# Patient Record
Sex: Male | Born: 1945 | Race: White | Hispanic: No | Marital: Married | Smoking: Current every day smoker
Health system: Southern US, Community
[De-identification: ages and names within clinical notes are randomized; demographics above are authoritative.]

## PROBLEM LIST (undated history)

## (undated) DIAGNOSIS — H269 Unspecified cataract: Secondary | ICD-10-CM

## (undated) DIAGNOSIS — D72820 Lymphocytosis (symptomatic): Secondary | ICD-10-CM

## (undated) DIAGNOSIS — H5052 Exophoria: Secondary | ICD-10-CM

## (undated) DIAGNOSIS — M4802 Spinal stenosis, cervical region: Secondary | ICD-10-CM

## (undated) DIAGNOSIS — L509 Urticaria, unspecified: Secondary | ICD-10-CM

## (undated) DIAGNOSIS — D869 Sarcoidosis, unspecified: Secondary | ICD-10-CM

## (undated) DIAGNOSIS — H9190 Unspecified hearing loss, unspecified ear: Secondary | ICD-10-CM

## (undated) DIAGNOSIS — G250 Essential tremor: Secondary | ICD-10-CM

## (undated) DIAGNOSIS — F0390 Unspecified dementia without behavioral disturbance: Secondary | ICD-10-CM

## (undated) DIAGNOSIS — E559 Vitamin D deficiency, unspecified: Secondary | ICD-10-CM

## (undated) DIAGNOSIS — E039 Hypothyroidism, unspecified: Secondary | ICD-10-CM

## (undated) DIAGNOSIS — J309 Allergic rhinitis, unspecified: Secondary | ICD-10-CM

## (undated) DIAGNOSIS — K219 Gastro-esophageal reflux disease without esophagitis: Secondary | ICD-10-CM

## (undated) DIAGNOSIS — F329 Major depressive disorder, single episode, unspecified: Secondary | ICD-10-CM

## (undated) DIAGNOSIS — C61 Malignant neoplasm of prostate: Secondary | ICD-10-CM

## (undated) DIAGNOSIS — J449 Chronic obstructive pulmonary disease, unspecified: Secondary | ICD-10-CM

## (undated) HISTORY — DX: Gastro-esophageal reflux disease without esophagitis: K21.9

## (undated) HISTORY — DX: Unspecified cataract: H26.9

## (undated) HISTORY — PX: PROSTATE SURGERY: SHX751

## (undated) HISTORY — DX: Sarcoidosis, unspecified: D86.9

## (undated) HISTORY — DX: Allergic rhinitis, unspecified: J30.9

## (undated) HISTORY — DX: Major depressive disorder, single episode, unspecified: F32.9

## (undated) HISTORY — DX: Vitamin D deficiency, unspecified: E55.9

## (undated) HISTORY — DX: Malignant neoplasm of prostate: C61

## (undated) HISTORY — DX: Unspecified dementia, unspecified severity, without behavioral disturbance, psychotic disturbance, mood disturbance, and anxiety: F03.90

## (undated) HISTORY — DX: Urticaria, unspecified: L50.9

## (undated) HISTORY — DX: Exophoria: H50.52

## (undated) HISTORY — DX: Lymphocytosis (symptomatic): D72.820

## (undated) HISTORY — PX: PENECTOMY: SHX741

## (undated) HISTORY — DX: Unspecified hearing loss, unspecified ear: H91.90

## (undated) HISTORY — DX: Chronic obstructive pulmonary disease, unspecified: J44.9

## (undated) HISTORY — DX: Hypothyroidism, unspecified: E03.9

## (undated) HISTORY — DX: Essential tremor: G25.0

## (undated) HISTORY — DX: Spinal stenosis, cervical region: M48.02

---

## 1998-08-03 ENCOUNTER — Emergency Department (HOSPITAL_COMMUNITY): Admission: EM | Admit: 1998-08-03 | Discharge: 1998-08-03 | Payer: Self-pay | Admitting: Emergency Medicine

## 1998-08-03 ENCOUNTER — Encounter: Payer: Self-pay | Admitting: Emergency Medicine

## 1998-08-04 ENCOUNTER — Inpatient Hospital Stay (HOSPITAL_COMMUNITY): Admission: EM | Admit: 1998-08-04 | Discharge: 1998-08-05 | Payer: Self-pay

## 2002-08-29 ENCOUNTER — Emergency Department (HOSPITAL_COMMUNITY): Admission: EM | Admit: 2002-08-29 | Discharge: 2002-08-29 | Payer: Self-pay | Admitting: Emergency Medicine

## 2002-08-29 ENCOUNTER — Encounter: Payer: Self-pay | Admitting: Emergency Medicine

## 2004-03-17 ENCOUNTER — Emergency Department (HOSPITAL_COMMUNITY): Admission: EM | Admit: 2004-03-17 | Discharge: 2004-03-17 | Payer: Self-pay | Admitting: Emergency Medicine

## 2004-03-30 ENCOUNTER — Ambulatory Visit (HOSPITAL_COMMUNITY): Admission: RE | Admit: 2004-03-30 | Discharge: 2004-03-30 | Payer: Self-pay | Admitting: Orthopedic Surgery

## 2006-02-17 IMAGING — CR DG CERVICAL SPINE COMPLETE 4+V
6 series · 6 of 6 positions shown · non-contrast
Comparison: none

CLINICAL DATA: Neck pain radiating to left shoulder.
 CERVICAL SPINE, FIVE VIEWS ? 03/17/2004:
 No prior studies for comparison.

[view not recorded (1 of 6)]
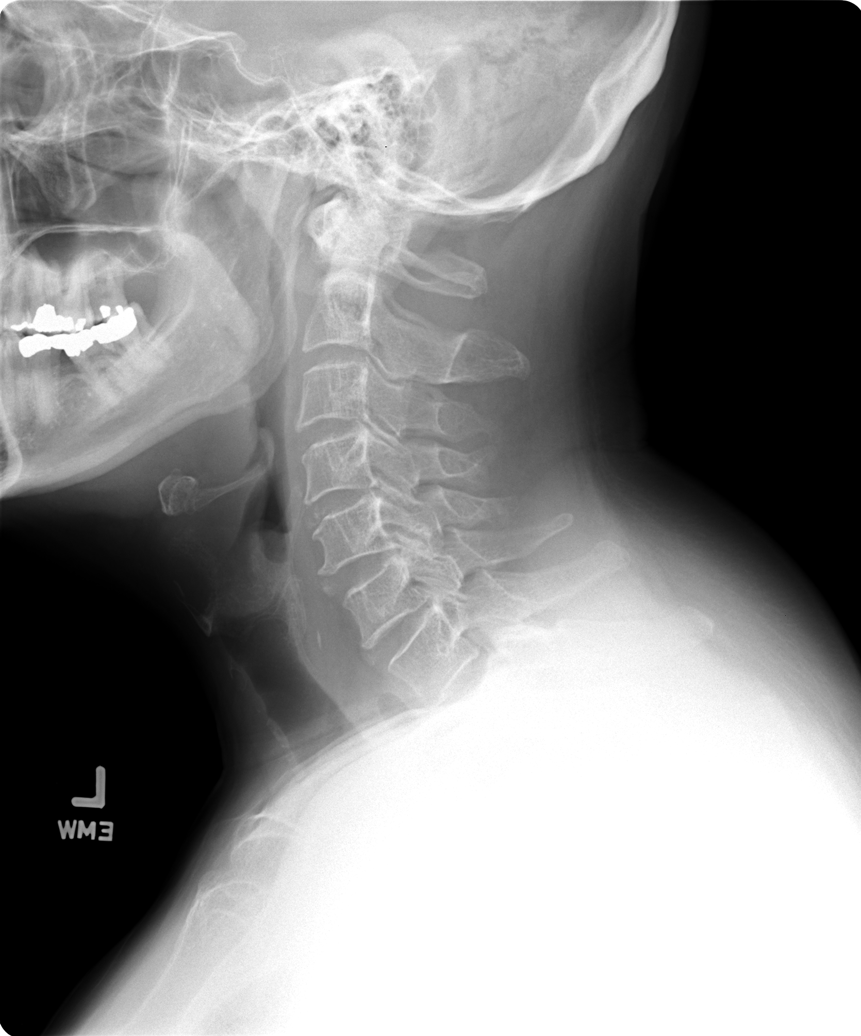

[view not recorded (2 of 6)]
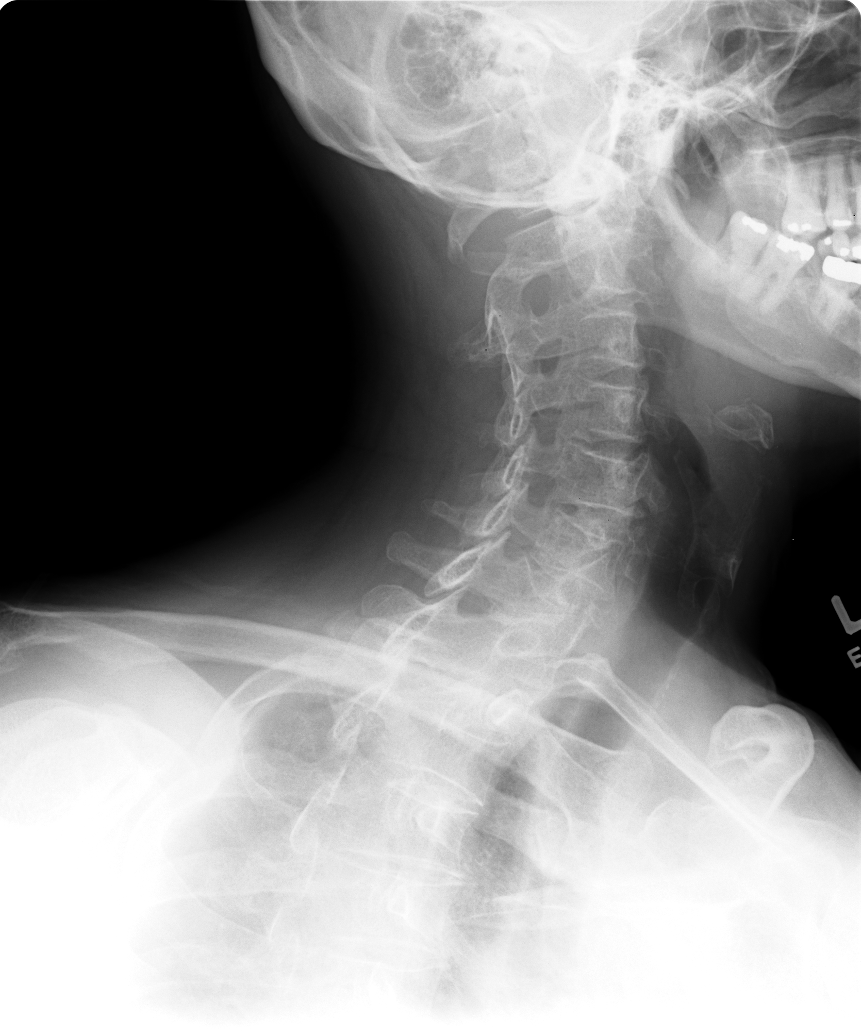

[view not recorded (3 of 6)]
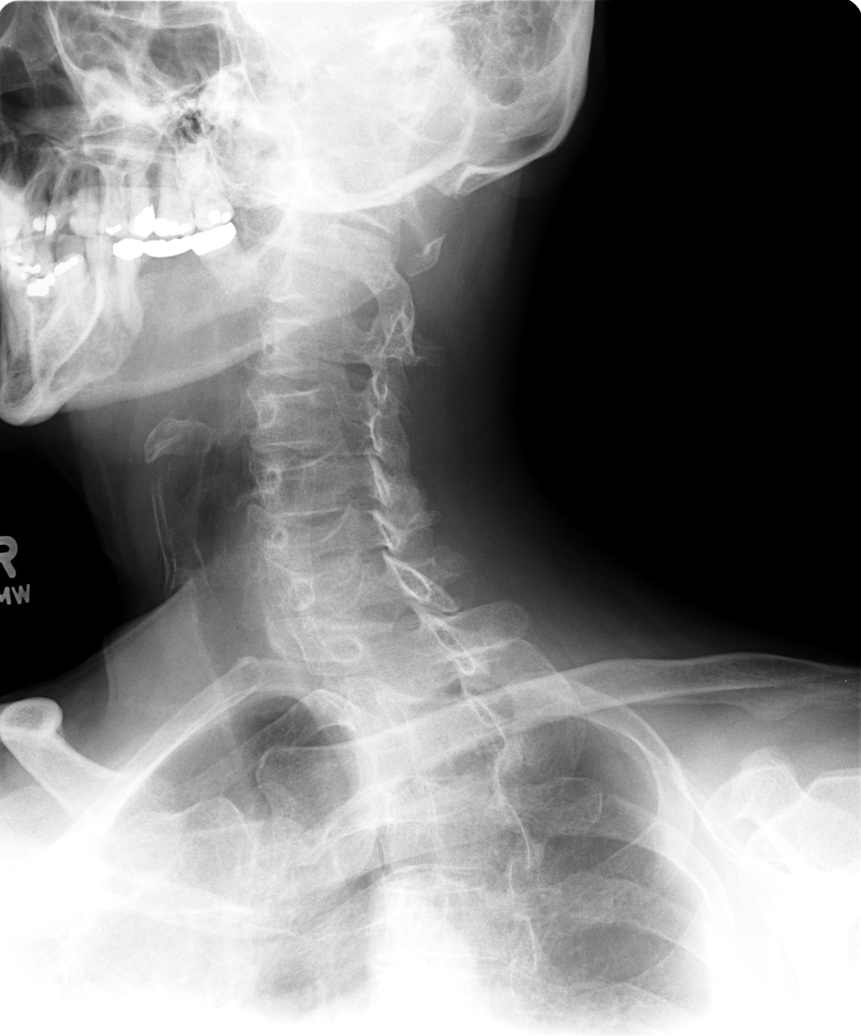

[view not recorded (4 of 6)]
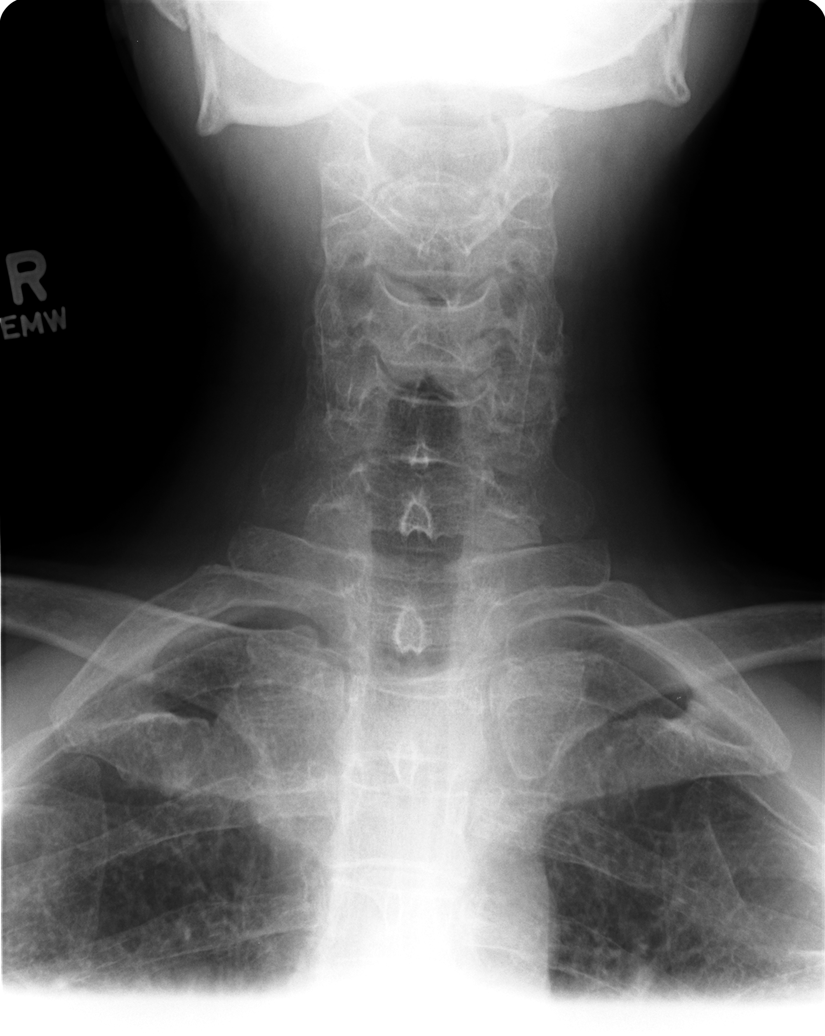

[view not recorded (5 of 6)]
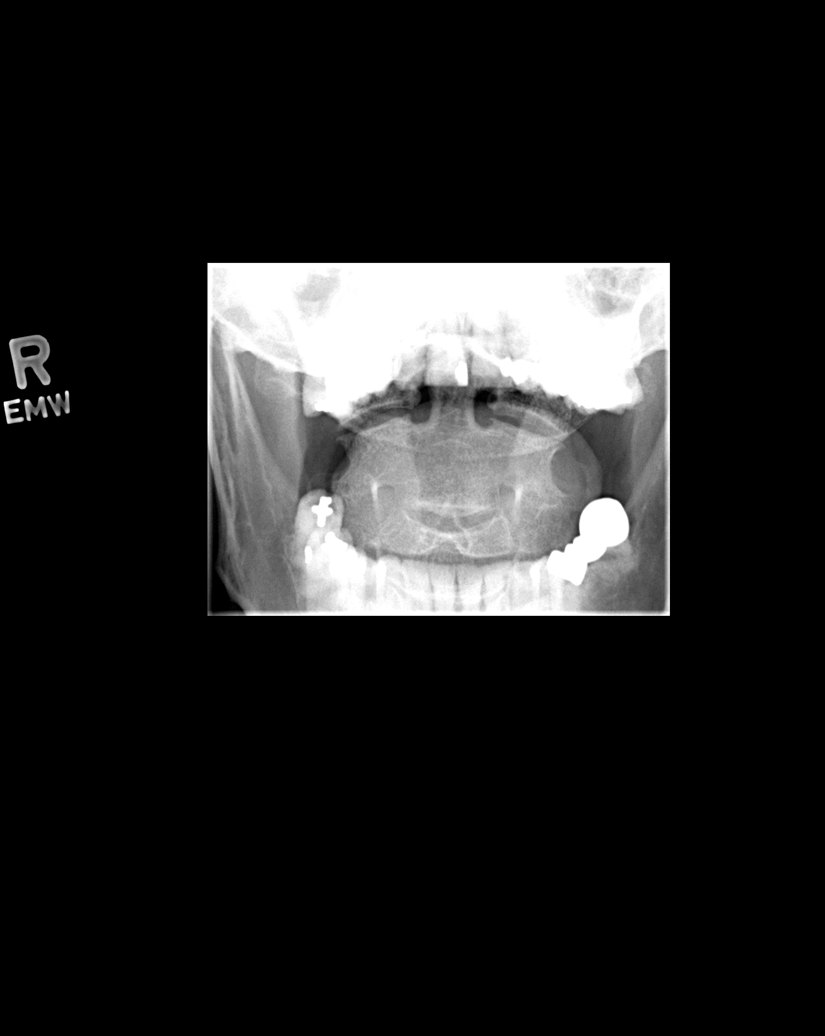

[view not recorded (6 of 6)]
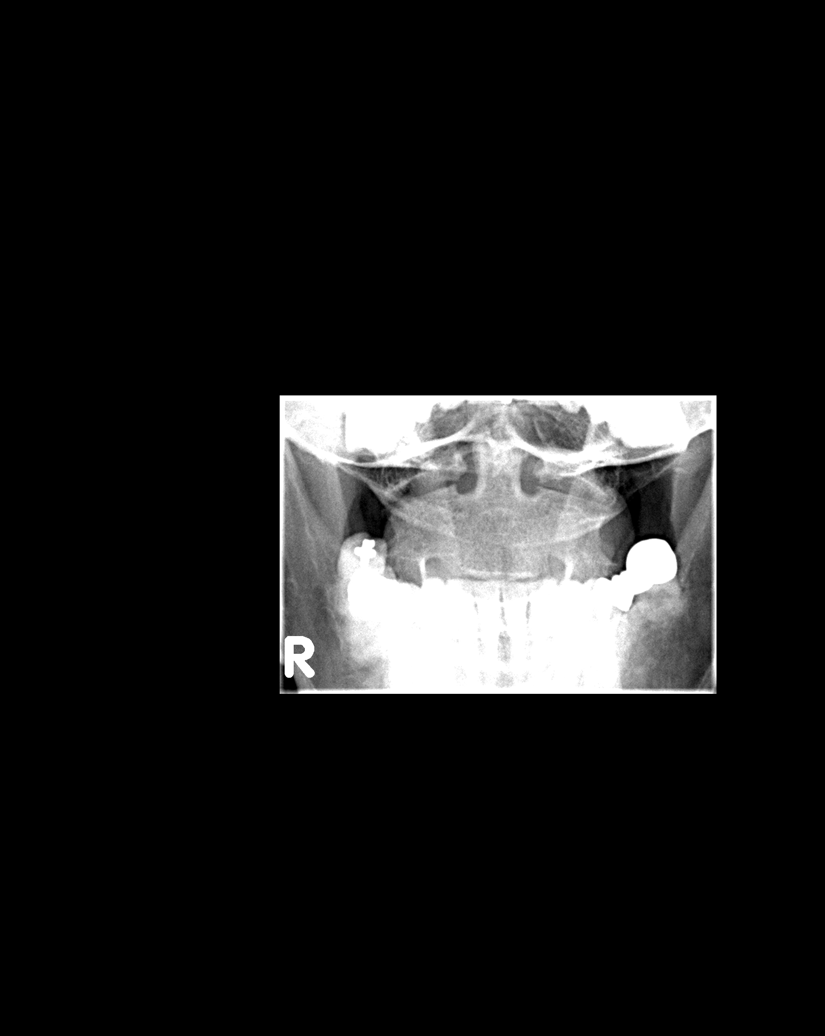

[6 of 6 positions shown; findings below may reference images not displayed]

FINDINGS: There is evidence of mild degenerative spondylosis involving C4-5, C5-6, and C6-7.  On oblique views bony foraminal stenosis is present at all three of these levels on the left and this may account for radicular symptoms.  If there are persistent symptoms, cervical MRI is recommended.  No evidence of fracture or listhesis.  Soft tissues are unremarkable.
IMPRESSION: Cervical spondylosis as described.  More prominent left-sided bony foraminal stenosis.

## 2009-11-27 ENCOUNTER — Encounter: Admission: RE | Admit: 2009-11-27 | Discharge: 2009-11-27 | Payer: Self-pay | Admitting: Family Medicine

## 2017-10-12 DIAGNOSIS — Z79899 Other long term (current) drug therapy: Secondary | ICD-10-CM | POA: Diagnosis not present

## 2017-10-12 DIAGNOSIS — Z23 Encounter for immunization: Secondary | ICD-10-CM | POA: Diagnosis not present

## 2017-10-12 DIAGNOSIS — F17200 Nicotine dependence, unspecified, uncomplicated: Secondary | ICD-10-CM | POA: Diagnosis not present

## 2017-10-12 DIAGNOSIS — G8911 Acute pain due to trauma: Secondary | ICD-10-CM | POA: Diagnosis not present

## 2017-10-12 DIAGNOSIS — Z888 Allergy status to other drugs, medicaments and biological substances status: Secondary | ICD-10-CM | POA: Diagnosis not present

## 2017-10-12 DIAGNOSIS — Z91040 Latex allergy status: Secondary | ICD-10-CM | POA: Diagnosis not present

## 2017-10-12 DIAGNOSIS — S0101XA Laceration without foreign body of scalp, initial encounter: Secondary | ICD-10-CM | POA: Diagnosis not present

## 2018-10-29 ENCOUNTER — Encounter: Payer: Self-pay | Admitting: Allergy

## 2018-10-29 ENCOUNTER — Encounter (INDEPENDENT_AMBULATORY_CARE_PROVIDER_SITE_OTHER): Payer: Self-pay

## 2018-10-29 ENCOUNTER — Ambulatory Visit (INDEPENDENT_AMBULATORY_CARE_PROVIDER_SITE_OTHER): Payer: PRIVATE HEALTH INSURANCE | Admitting: Allergy

## 2018-10-29 ENCOUNTER — Other Ambulatory Visit: Payer: Self-pay

## 2018-10-29 VITALS — BP 130/70 | HR 60 | Temp 98.1°F | Resp 18 | Ht 72.0 in | Wt 185.0 lb

## 2018-10-29 DIAGNOSIS — H1013 Acute atopic conjunctivitis, bilateral: Secondary | ICD-10-CM | POA: Diagnosis not present

## 2018-10-29 DIAGNOSIS — J449 Chronic obstructive pulmonary disease, unspecified: Secondary | ICD-10-CM

## 2018-10-29 DIAGNOSIS — J3089 Other allergic rhinitis: Secondary | ICD-10-CM

## 2018-10-29 MED ORDER — IPRATROPIUM BROMIDE 0.06 % NA SOLN: NASAL | 2 refills | Status: DC

## 2018-10-29 MED ORDER — LEVOCETIRIZINE DIHYDROCHLORIDE 5 MG PO TABS: 5.0000 mg | ORAL_TABLET | Freq: Every evening | ORAL | 2 refills | Status: DC

## 2018-10-29 MED ORDER — EPINEPHRINE 0.3 MG/0.3ML IJ SOAJ: 0.3000 mg | INTRAMUSCULAR | 1 refills | Status: AC | PRN

## 2018-10-29 NOTE — Patient Instructions (Addendum)
Allergic rhinitis with conjunctivitis  - on previous testing found to be allergic to grasses, trees, weeds, dust mites, molds  - on testing today remains allergic to grasses, trees, weeds, dust mites, molds  - allergen avoidance measures discussed/handouts provided  - recommend changing Zyrtec to either Allegra or Xyzal  - Azelastine not effective in controlling runny nose  - will try nasal Atrovent 2 sprays each nostril as needed for runny nose up to 3-4 time a day  - continue Singulair 10mg  daily  - allergen immunotherapy discussed today including protocol, benefits and risk.  Will proceed with this therapy.  Will prescribe Epipen to carry on days of your injections.  Consent signed today.  Schedule for start injection appointment.    - discuss with your eye doctor regarding your dry eye.   COPD  - continue recommendation per your [facility] with use of Tiotropium 2 puffs daily and Albuterol as needed   Follow-up 4-6 months or sooner if needed

## 2018-10-29 NOTE — Progress Notes (Signed)
New Patient Note  RE: Antonio Edwards MRN: [id number] DOB: 1945-05-05 Date of Office Visit: 10/29/2018  Referring provider: Dierdre Highman, MD Primary care provider: Lorelei Pont, MD  Chief Complaint: allergy shots  History of present illness: Antonio Edwards is a 73 y.o. male presenting today for consultation for allergen immunotherapy.  He follows with Dr. Shelah Lewandowsky at [facility] for allergy care.  He is referred today to start immunotherapy.    He states he has symptoms of watery eyes, runny nose with PND with hoarseness and cough.  Symptoms can occur year-round.  Symptoms ongoing for the past year.   He is using azelastine twice a day and he feels it makes his nose run more.  He does not feel the azelastine works.  He also is on zyrtec that he believes he has been on for the past year.  He also takes singulair daily. With his eye symptoms he reports having dry eye and does follow with an eye doctor at [facility].      He has COPD managed by his [place] allergist.  He is on tiotropium that he takes daily.  He states he does not have a albuterol inhaler at this time.    [facility] he had serum IgE environmental testing that showed in kU/L: d.farinae 0.16, Timothy grass 1.59, cockroach 0.12, penicillium 0.86, Cladosporium 0.26, Aspergillus 0.61, mountain cedar 0.12.  Total IgE 1716.  Skin prick testing was positive to molds, tree pollens, grass pollens, weed pollens. He had a sinus CT in 2019 that showed minimal mucosal thickening along the right sphenoid sinus otherwise clear paranasal sinuses.  Ostiomeatal units were patent.  Nasal passages patent.  Minimal leftward deviation of the septum. CT chest 2019 that showed areas of bronchial wall thickening and irregularity as can be seen with chronic bronchitis.  Patchy subpleural groundglass attenuation and mild reticulation are again noted and minimally changed dating back to the most remote examination.  A few punctate calcified granulomas are  seen in the left lung.  Stable appearance of the previously noted small bilateral nodules with reference 5 mm left lung base and lateral right lower lobe subpleural nodules, and 6 mm subpleural right middle lobe nodule.  Impression: Benign appearance.  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  Small nodules again noted.  Continue annual low-dose CT screening yearly.  Review of systems: Review of Systems  Constitutional: Negative for chills, fever and malaise/fatigue.  HENT: Negative for congestion, ear discharge, nosebleeds and sore throat.   Eyes: Negative for pain, discharge and redness.  Respiratory: Negative for cough, shortness of breath and wheezing.   Cardiovascular: Negative for chest pain.  Gastrointestinal: Negative for abdominal pain, constipation, diarrhea, heartburn, nausea and vomiting.  Musculoskeletal: Negative for joint pain.  Skin: Negative for itching and rash.  Neurological: Negative for headaches.    All other systems negative unless noted above in HPI  Past medical history: Past Medical History:  Diagnosis Date  . Prostate cancer ([place])   . Urticaria     Past surgical history: Past Surgical History:  Procedure Laterality Date  . PENECTOMY     to repair urinary leakage after prostate removal  . PROSTATE SURGERY     removal due to cancer    Family history:  Family History  Problem Relation Age of Onset  . Asthma Mother   . Asthma Father   . Lung disease Father     Social history: Lives in a home with carpeting with gas heating and  central cooling.  There are dogs and cats in the home.  No concern for water damage, mildew averages in the home.  He is a retired English as a second language teacher. Tobacco Use  . Smoking status: Current Every Day Smoker    Packs/day: 0.50    Types: Cigarettes  . Smokeless tobacco: Never Used    Medication List: Allergies as of 10/29/2018      Reactions   Alendronate Other (See Comments)   Red all over, skin begun falling off as if he had been  burned   Latex Hives   Aricept [donepezil Hcl] Other (See Comments)   Loss of taste      Medication List       Accurate as of October 29, 2018 12:21 PM. If you have any questions, ask your nurse or doctor.        albuterol 108 (90 Base) MCG/ACT inhaler Commonly known as: VENTOLIN HFA Inhale 2 puffs into the lungs every 6 (six) hours as needed for wheezing or shortness of breath.   alprostadil 125 MCG pellet Commonly known as: MUSE 125 mcg by Transurethral route as needed for erectile dysfunction. use no more than 3 times per week   aspirin EC 81 MG tablet Take 81 mg by mouth daily.   atorvastatin 20 MG tablet Commonly known as: LIPITOR Take 60 mg by mouth daily.   Azelastine HCl 137 MCG/SPRAY Soln Place 2 sprays into both nostrils 2 (two) times daily as needed.   Calcium 600-D 600-400 MG-UNIT Tabs Generic drug: Calcium Carbonate-Vitamin D3 Take 1 tablet by mouth 2 (two) times daily.   cetirizine 10 MG tablet Commonly known as: ZYRTEC Take 10 mg by mouth daily.   citalopram 40 MG tablet Commonly known as: CELEXA Take 40 mg by mouth daily.   EPINEPHrine 0.3 mg/0.3 mL Soaj injection Commonly known as: EPI-PEN Inject 0.3 mLs (0.3 mg total) into the muscle as needed for anaphylaxis. Started by: Shaylar Charmian Muff, MD   GLYCERIN-HYPROMELLOSE-PEG 400 OP Apply 1 drop to eye 2 (two) times daily.   ibuprofen 600 MG tablet Commonly known as: ADVIL Take 600 mg by mouth 3 (three) times daily as needed.   ipratropium 0.06 % nasal spray Commonly known as: ATROVENT 2 sprays in each nostril as needed up to 3-4 times daily. Started by: Shaylar Charmian Muff, MD   ketoconazole 2 % shampoo Commonly known as: NIZORAL Apply 1 application topically 3 (three) times a week.   levocetirizine 5 MG tablet Commonly known as: XYZAL Take 1 tablet (5 mg total) by mouth every evening. Started by: Shaylar Charmian Muff, MD   levothyroxine 75 MCG tablet Commonly known  as: SYNTHROID Take 75 mcg by mouth daily before breakfast.   memantine 10 MG tablet Commonly known as: NAMENDA Take 20 mg by mouth at bedtime.   montelukast 10 MG tablet Commonly known as: SINGULAIR Take 10 mg by mouth at bedtime.   rivastigmine 6 MG capsule Commonly known as: EXELON Take 6 mg by mouth 2 (two) times daily.   sildenafil 100 MG tablet Commonly known as: VIAGRA Take 100 mg by mouth daily as needed for erectile dysfunction.   Tiotropium Bromide Monohydrate 2.5 MCG/ACT Aers Inhale 2 puffs into the lungs daily.   Vitamin D (Cholecalciferol) 50 MCG (2000 UT) Caps Take 1 tablet by mouth daily.       Known medication allergies: Allergies  Allergen Reactions  . Alendronate Other (See Comments)    Red all over, skin begun falling off as if he  had been burned  . Latex Hives  . Aricept [Donepezil Hcl] Other (See Comments)    Loss of taste    Physical examination: Blood pressure 130/70, pulse 60, temperature 98.1 F (36.7 C), temperature source Temporal, resp. rate 18, height 6' (1.829 m), weight 185 lb (83.9 kg), SpO2 96 %.  General: Alert, interactive, in no acute distress. HEENT: PERRLA, TMs pearly gray, turbinates minimally edematous with clear discharge, post-pharynx non erythematous. Neck: Supple without lymphadenopathy. Lungs: Clear to auscultation without wheezing, rhonchi or rales. {no increased work of breathing. CV: Normal S1, S2 without murmurs. Abdomen: Nondistended, nontender. Skin: Warm and dry, without lesions or rashes. Extremities:  No clubbing, cyanosis or edema. Neuro:   Grossly intact.  Diagnositics/Labs: Labs: see HPI  Spirometry: FEV1: 2.07L 59%, FVC: 2.93L 61% predicted.   Allergy testing: environmental allergy skin prick testing is positive to grass pollens, weed pollens, tree pollens, molds, dust mites.  Allergy testing results were read and interpreted by provider, documented by clinical staff.   Assessment and plan:    Allergic rhinitis with conjunctivitis  - on previous testing found to be allergic to grasses, trees, weeds, dust mites, molds  - on testing today remains allergic to grasses, trees, weeds, dust mites, molds  - allergen avoidance measures discussed/handouts provided  - recommend changing Zyrtec to either Allegra or Xyzal  - Azelastine not effective in controlling runny nose  - will try nasal Atrovent 2 sprays each nostril as needed for runny nose up to 3-4 time a day  - continue Singulair 10mg  daily  - allergen immunotherapy discussed today including protocol, benefits and risk.  Will proceed with this therapy.  Will prescribe Epipen to carry on days of your injections.  Consent signed today.  Schedule for start injection appointment.    - discuss with your eye doctor regarding your dry eye.   COPD  - continue recommendation per your [facility] All[facility] with use of Tiotropium 2 puffs daily and Albuterol as needed   Follow-up 4-6 months or sooner if needed I appreciate the opportunity to take part in [place] care. Please do not hesitate to contact me with questions.  Sincerely,   Prudy Feeler, MD Allergy/Immunology [facility] [place] [facility] [place]

## 2018-10-30 ENCOUNTER — Telehealth: Payer: Self-pay

## 2018-10-30 MED ORDER — LEVOCETIRIZINE DIHYDROCHLORIDE 5 MG PO TABS: 5.0000 mg | ORAL_TABLET | Freq: Every evening | ORAL | 2 refills | Status: AC

## 2018-10-30 MED ORDER — LEVOCETIRIZINE DIHYDROCHLORIDE 5 MG PO TABS: 5.0000 mg | ORAL_TABLET | Freq: Every evening | ORAL | 2 refills | Status: DC

## 2018-10-30 MED ORDER — LORATADINE 10 MG PO TABS: 10.0000 mg | ORAL_TABLET | Freq: Every day | ORAL | 1 refills | Status: DC

## 2018-10-30 NOTE — Addendum Note (Signed)
Addended by: Lucrezia Starch I on: 10/30/2018 01:27 PM   Modules accepted: Orders

## 2018-10-30 NOTE — Addendum Note (Signed)
Addended by: Lucrezia Starch I on: 10/30/2018 10:46 AM   Modules accepted: Orders

## 2018-10-30 NOTE — Telephone Encounter (Signed)
Patient's insurance will not cover Xyzal. Suggested alternatives are Zyrtec or Claritin. Per verbal discussion with Dr. Lubertha Basque I have switched patient's prescription to loratadine 10 mg tablet po qd.

## 2018-10-30 NOTE — Addendum Note (Signed)
Addended by: Lucrezia Starch I on: 10/30/2018 11:50 AM   Modules accepted: Orders

## 2018-11-03 DIAGNOSIS — J301 Allergic rhinitis due to pollen: Secondary | ICD-10-CM

## 2018-11-03 NOTE — Progress Notes (Signed)
VIALS EXP 11-03-19

## 2018-11-03 NOTE — Addendum Note (Signed)
Addended by: Theresia Lo on: 11/03/2018 02:56 PM   Modules accepted: Orders

## 2018-11-04 DIAGNOSIS — J3089 Other allergic rhinitis: Secondary | ICD-10-CM

## 2018-11-12 ENCOUNTER — Ambulatory Visit (INDEPENDENT_AMBULATORY_CARE_PROVIDER_SITE_OTHER): Payer: PRIVATE HEALTH INSURANCE | Admitting: *Deleted

## 2018-11-12 ENCOUNTER — Other Ambulatory Visit: Payer: Self-pay

## 2018-11-12 DIAGNOSIS — J309 Allergic rhinitis, unspecified: Secondary | ICD-10-CM

## 2018-11-12 NOTE — Progress Notes (Signed)
Immunotherapy   Patient Details  Name: Antonio Edwards MRN: [id number] Date of Birth: [birth year]  11/12/2018  [name] started injections for  Pollen & Mite-Mold-CR Following schedule: B  Frequency:1 time per week Epi-Pen:Epi-Pen Available  Consent signed and patient instructions given. No problems after 30 minutes in the office.   [name] 11/12/2018, 9:08 AM

## 2018-11-19 ENCOUNTER — Ambulatory Visit (INDEPENDENT_AMBULATORY_CARE_PROVIDER_SITE_OTHER): Payer: PRIVATE HEALTH INSURANCE | Admitting: *Deleted

## 2018-11-19 DIAGNOSIS — J309 Allergic rhinitis, unspecified: Secondary | ICD-10-CM

## 2018-11-26 ENCOUNTER — Ambulatory Visit (INDEPENDENT_AMBULATORY_CARE_PROVIDER_SITE_OTHER): Payer: PRIVATE HEALTH INSURANCE | Admitting: *Deleted

## 2018-11-26 DIAGNOSIS — J309 Allergic rhinitis, unspecified: Secondary | ICD-10-CM | POA: Diagnosis not present

## 2018-12-03 ENCOUNTER — Ambulatory Visit (INDEPENDENT_AMBULATORY_CARE_PROVIDER_SITE_OTHER): Payer: PRIVATE HEALTH INSURANCE

## 2018-12-03 DIAGNOSIS — J309 Allergic rhinitis, unspecified: Secondary | ICD-10-CM

## 2018-12-10 ENCOUNTER — Ambulatory Visit (INDEPENDENT_AMBULATORY_CARE_PROVIDER_SITE_OTHER): Payer: PRIVATE HEALTH INSURANCE

## 2018-12-10 ENCOUNTER — Encounter: Payer: Self-pay | Admitting: *Deleted

## 2018-12-10 DIAGNOSIS — J309 Allergic rhinitis, unspecified: Secondary | ICD-10-CM | POA: Diagnosis not present

## 2018-12-17 ENCOUNTER — Ambulatory Visit (INDEPENDENT_AMBULATORY_CARE_PROVIDER_SITE_OTHER): Payer: PRIVATE HEALTH INSURANCE | Admitting: *Deleted

## 2018-12-17 DIAGNOSIS — J309 Allergic rhinitis, unspecified: Secondary | ICD-10-CM | POA: Diagnosis not present

## 2018-12-24 ENCOUNTER — Ambulatory Visit (INDEPENDENT_AMBULATORY_CARE_PROVIDER_SITE_OTHER): Payer: PRIVATE HEALTH INSURANCE | Admitting: *Deleted

## 2018-12-24 DIAGNOSIS — J309 Allergic rhinitis, unspecified: Secondary | ICD-10-CM

## 2018-12-31 ENCOUNTER — Ambulatory Visit (INDEPENDENT_AMBULATORY_CARE_PROVIDER_SITE_OTHER): Payer: PRIVATE HEALTH INSURANCE

## 2018-12-31 DIAGNOSIS — J309 Allergic rhinitis, unspecified: Secondary | ICD-10-CM

## 2019-01-07 ENCOUNTER — Ambulatory Visit (INDEPENDENT_AMBULATORY_CARE_PROVIDER_SITE_OTHER): Payer: PRIVATE HEALTH INSURANCE | Admitting: *Deleted

## 2019-01-07 DIAGNOSIS — J309 Allergic rhinitis, unspecified: Secondary | ICD-10-CM

## 2019-01-14 ENCOUNTER — Ambulatory Visit (INDEPENDENT_AMBULATORY_CARE_PROVIDER_SITE_OTHER): Payer: PRIVATE HEALTH INSURANCE | Admitting: *Deleted

## 2019-01-14 DIAGNOSIS — J309 Allergic rhinitis, unspecified: Secondary | ICD-10-CM

## 2019-01-21 ENCOUNTER — Ambulatory Visit (INDEPENDENT_AMBULATORY_CARE_PROVIDER_SITE_OTHER): Payer: PRIVATE HEALTH INSURANCE

## 2019-01-21 DIAGNOSIS — J309 Allergic rhinitis, unspecified: Secondary | ICD-10-CM | POA: Diagnosis not present

## 2019-01-28 ENCOUNTER — Ambulatory Visit (INDEPENDENT_AMBULATORY_CARE_PROVIDER_SITE_OTHER): Payer: PRIVATE HEALTH INSURANCE | Admitting: *Deleted

## 2019-01-28 DIAGNOSIS — J309 Allergic rhinitis, unspecified: Secondary | ICD-10-CM | POA: Diagnosis not present

## 2019-02-04 ENCOUNTER — Ambulatory Visit (INDEPENDENT_AMBULATORY_CARE_PROVIDER_SITE_OTHER): Payer: PRIVATE HEALTH INSURANCE

## 2019-02-04 DIAGNOSIS — J309 Allergic rhinitis, unspecified: Secondary | ICD-10-CM

## 2019-02-10 ENCOUNTER — Ambulatory Visit (INDEPENDENT_AMBULATORY_CARE_PROVIDER_SITE_OTHER): Payer: PRIVATE HEALTH INSURANCE | Admitting: *Deleted

## 2019-02-10 DIAGNOSIS — J309 Allergic rhinitis, unspecified: Secondary | ICD-10-CM

## 2019-02-18 ENCOUNTER — Ambulatory Visit (INDEPENDENT_AMBULATORY_CARE_PROVIDER_SITE_OTHER): Payer: No Typology Code available for payment source | Admitting: *Deleted

## 2019-02-18 DIAGNOSIS — J309 Allergic rhinitis, unspecified: Secondary | ICD-10-CM | POA: Diagnosis not present

## 2019-02-25 ENCOUNTER — Ambulatory Visit (INDEPENDENT_AMBULATORY_CARE_PROVIDER_SITE_OTHER): Payer: No Typology Code available for payment source

## 2019-02-25 DIAGNOSIS — J309 Allergic rhinitis, unspecified: Secondary | ICD-10-CM

## 2019-03-04 ENCOUNTER — Ambulatory Visit (INDEPENDENT_AMBULATORY_CARE_PROVIDER_SITE_OTHER): Payer: No Typology Code available for payment source

## 2019-03-04 DIAGNOSIS — J309 Allergic rhinitis, unspecified: Secondary | ICD-10-CM | POA: Diagnosis not present

## 2019-03-10 ENCOUNTER — Ambulatory Visit (INDEPENDENT_AMBULATORY_CARE_PROVIDER_SITE_OTHER): Payer: No Typology Code available for payment source | Admitting: *Deleted

## 2019-03-10 DIAGNOSIS — J309 Allergic rhinitis, unspecified: Secondary | ICD-10-CM

## 2019-03-18 ENCOUNTER — Ambulatory Visit (INDEPENDENT_AMBULATORY_CARE_PROVIDER_SITE_OTHER): Payer: No Typology Code available for payment source

## 2019-03-18 DIAGNOSIS — J309 Allergic rhinitis, unspecified: Secondary | ICD-10-CM

## 2019-03-25 ENCOUNTER — Ambulatory Visit (INDEPENDENT_AMBULATORY_CARE_PROVIDER_SITE_OTHER): Payer: No Typology Code available for payment source

## 2019-03-25 DIAGNOSIS — J309 Allergic rhinitis, unspecified: Secondary | ICD-10-CM

## 2019-03-31 ENCOUNTER — Encounter: Payer: Self-pay | Admitting: Allergy

## 2019-03-31 ENCOUNTER — Ambulatory Visit (INDEPENDENT_AMBULATORY_CARE_PROVIDER_SITE_OTHER): Payer: No Typology Code available for payment source | Admitting: Allergy

## 2019-03-31 ENCOUNTER — Other Ambulatory Visit: Payer: Self-pay

## 2019-03-31 VITALS — BP 136/80 | HR 57 | Wt 191.6 lb

## 2019-03-31 DIAGNOSIS — J3089 Other allergic rhinitis: Secondary | ICD-10-CM

## 2019-03-31 DIAGNOSIS — H1013 Acute atopic conjunctivitis, bilateral: Secondary | ICD-10-CM

## 2019-03-31 DIAGNOSIS — J449 Chronic obstructive pulmonary disease, unspecified: Secondary | ICD-10-CM | POA: Diagnosis not present

## 2019-03-31 NOTE — Patient Instructions (Addendum)
Allergic rhinitis with conjunctivitis  - continue avoidance measures for grasses, trees, weeds, dust mites, molds  - recommend either Allegra or Xyzal for your long-acting antihistamine  - Azelastine not effective in controlling runny nose  - continue use of nasal Atrovent 2 sprays each nostril as needed for runny nose up to 3-4 time a day  - continue Singulair 10mg  daily  - continue allergen immunotherapy (allergy shots) per schedule.  You are now in the maintenance (red) vials!  COPD  - continue recommendation per your [facility] Allergist with use of Tiotropium 2 puffs daily and Albuterol as needed   Follow-up 6 months or sooner if needed

## 2019-03-31 NOTE — Progress Notes (Signed)
Follow-up Note  RE: Antonio Edwards MRN: [id number] DOB: 1946/01/17 Date of Office Visit: 03/31/2019   History of present illness: Antonio Edwards is a 74 y.o. male presenting today for follow-up of allergic rhinitis with conjunctivitis on immunotherapy and COPD.  His COPD is being followed by the [place].  He was last seen in the office on 10/29/2018 by myself.  He states he has been doing well since the last visit without any major health changes, surgeries or hospitalizations.   He does states he has dry eyes however reports a lot of watery eyes and has been told he has issues with clog tear duct.  He has an upcoming [facility] appt to discuss treatments for his tear ducts.   He has reached red vial of his allergy shots.  He states he does feel allergy symptoms have decreased since being on shots but does have occasional nasal drainage.  He does have nasal atrovent to use for nasal drainage.  He does continue to take singulair daily.  Currently not taking any antihistamines.   He continues on tiotropium for management of COPD.    Review of systems: Review of Systems  Constitutional: Negative.   HENT: Negative.   Eyes: Positive for discharge (watery).  Respiratory: Negative.   Cardiovascular: Negative.   Gastrointestinal: Negative.   Musculoskeletal: Negative.   Skin: Negative.   Neurological: Negative.     All other systems negative unless noted above in HPI  Past medical/social/surgical/family history have been reviewed and are unchanged unless specifically indicated below.  No changes  Medication List: Current Outpatient Medications  Medication Sig Dispense Refill  . albuterol (VENTOLIN HFA) 108 (90 Base) MCG/ACT inhaler Inhale 2 puffs into the lungs every 6 (six) hours as needed for wheezing or shortness of breath.    . alprostadil (MUSE) 125 MCG pellet 125 mcg by Transurethral route as needed for erectile dysfunction. use no more than 3 times per week    . aspirin EC 81 MG  tablet Take 81 mg by mouth daily.    Marland Kitchen atorvastatin (LIPITOR) 20 MG tablet Take 60 mg by mouth daily.    . Calcium Carbonate-Vitamin D3 (CALCIUM 600-D) 600-400 MG-UNIT TABS Take 1 tablet by mouth 2 (two) times daily.    . cetirizine (ZYRTEC) 10 MG tablet Take 10 mg by mouth daily.    . citalopram (CELEXA) 40 MG tablet Take 40 mg by mouth daily.    Marland Kitchen EPINEPHrine 0.3 mg/0.3 mL IJ SOAJ injection Inject 0.3 mLs (0.3 mg total) into the muscle as needed for anaphylaxis. 2 each 1  . GLYCERIN-HYPROMELLOSE-PEG 400 OP Apply 1 drop to eye 2 (two) times daily.    Marland Kitchen ibuprofen (ADVIL) 600 MG tablet Take 600 mg by mouth 3 (three) times daily as needed.    Marland Kitchen ketoconazole (NIZORAL) 2 % shampoo Apply 1 application topically 3 (three) times a week.    . levocetirizine (XYZAL) 5 MG tablet Take 1 tablet (5 mg total) by mouth every evening. 90 tablet 2  . levothyroxine (SYNTHROID) 75 MCG tablet Take 75 mcg by mouth daily before breakfast.    . memantine (NAMENDA) 10 MG tablet Take 20 mg by mouth at bedtime.    . montelukast (SINGULAIR) 10 MG tablet Take 10 mg by mouth at bedtime.    . rivastigmine (EXELON) 6 MG capsule Take 6 mg by mouth 2 (two) times daily.    . sildenafil (VIAGRA) 100 MG tablet Take 100 mg by mouth daily  as needed for erectile dysfunction.    . Tiotropium Bromide Monohydrate 2.5 MCG/ACT AERS Inhale 2 puffs into the lungs daily.    . Vitamin D, Cholecalciferol, 50 MCG (2000 UT) CAPS Take 1 tablet by mouth daily.    Marland Kitchen ipratropium (ATROVENT) 0.06 % nasal spray 2 sprays in each nostril as needed up to 3-4 times daily. (Patient not taking: Reported on 03/31/2019) 45 mL 2   No current facility-administered medications for this visit.     Known medication allergies: Allergies  Allergen Reactions  . Alendronate Other (See Comments)    Red all over, skin begun falling off as if he had been burned  . Latex Hives  . Aricept [Donepezil Hcl] Other (See Comments)    Loss of taste     Physical  examination: Blood pressure 136/80, pulse (!) 57, weight 191 lb 9.6 oz (86.9 kg), SpO2 97 %.  General: Alert, interactive, in no acute distress. HEENT: PERRLA, TMs pearly gray, turbinates non-edematous without discharge, post-pharynx non erythematous. Neck: Supple without lymphadenopathy. Lungs: Clear to auscultation without wheezing, rhonchi or rales. {no increased work of breathing. CV: Normal S1, S2 without murmurs. Abdomen: Nondistended, nontender. Skin: Warm and dry, without lesions or rashes. Extremities:  No clubbing, cyanosis or edema. Neuro:   Grossly intact.  Diagnositics/Labs: Immunotherapy injections given today in office  Assessment and plan:   Allergic rhinitis with conjunctivitis  - doing well on immunotherapy now at maintenance dosing  - continue avoidance measures for grasses, trees, weeds, dust mites, molds  - recommend either Allegra or Xyzal for your long-acting antihistamine  - Azelastine not effective in controlling runny nose  - continue use of nasal Atrovent 2 sprays each nostril as needed for runny nose up to 3-4 time a day  - continue Singulair 10mg  daily  - continue allergen immunotherapy (allergy shots) per schedule.   COPD  - continue recommendation per your [facility] All[facility] with use of Tiotropium 2 puffs daily and Albuterol as needed   Follow-up 6 months or sooner if needed  I appreciate the opportunity to take part in [place] care. Please do not hesitate to contact me with questions. Sincerely,   Prudy Feeler, MD Allergy/Immunology [facility] [place] [facility] [place]

## 2019-04-08 ENCOUNTER — Ambulatory Visit (INDEPENDENT_AMBULATORY_CARE_PROVIDER_SITE_OTHER): Payer: No Typology Code available for payment source

## 2019-04-08 DIAGNOSIS — J3089 Other allergic rhinitis: Secondary | ICD-10-CM

## 2019-04-15 ENCOUNTER — Ambulatory Visit (INDEPENDENT_AMBULATORY_CARE_PROVIDER_SITE_OTHER): Payer: No Typology Code available for payment source

## 2019-04-15 DIAGNOSIS — J3089 Other allergic rhinitis: Secondary | ICD-10-CM

## 2019-04-22 ENCOUNTER — Encounter: Payer: Self-pay | Admitting: Allergy

## 2019-04-22 ENCOUNTER — Ambulatory Visit (INDEPENDENT_AMBULATORY_CARE_PROVIDER_SITE_OTHER): Payer: No Typology Code available for payment source

## 2019-04-22 DIAGNOSIS — J3089 Other allergic rhinitis: Secondary | ICD-10-CM

## 2019-04-29 ENCOUNTER — Ambulatory Visit (INDEPENDENT_AMBULATORY_CARE_PROVIDER_SITE_OTHER): Payer: No Typology Code available for payment source

## 2019-04-29 DIAGNOSIS — J3089 Other allergic rhinitis: Secondary | ICD-10-CM | POA: Diagnosis not present

## 2019-05-07 ENCOUNTER — Ambulatory Visit (INDEPENDENT_AMBULATORY_CARE_PROVIDER_SITE_OTHER): Payer: No Typology Code available for payment source | Admitting: *Deleted

## 2019-05-07 DIAGNOSIS — J309 Allergic rhinitis, unspecified: Secondary | ICD-10-CM

## 2019-05-13 ENCOUNTER — Ambulatory Visit (INDEPENDENT_AMBULATORY_CARE_PROVIDER_SITE_OTHER): Payer: No Typology Code available for payment source | Admitting: *Deleted

## 2019-05-13 DIAGNOSIS — J309 Allergic rhinitis, unspecified: Secondary | ICD-10-CM

## 2019-05-20 ENCOUNTER — Ambulatory Visit (INDEPENDENT_AMBULATORY_CARE_PROVIDER_SITE_OTHER): Payer: No Typology Code available for payment source

## 2019-05-20 DIAGNOSIS — J309 Allergic rhinitis, unspecified: Secondary | ICD-10-CM | POA: Diagnosis not present

## 2019-05-26 NOTE — Progress Notes (Signed)
VIALS EXP 05-25-20

## 2019-05-27 ENCOUNTER — Ambulatory Visit (INDEPENDENT_AMBULATORY_CARE_PROVIDER_SITE_OTHER): Payer: No Typology Code available for payment source

## 2019-05-27 DIAGNOSIS — J309 Allergic rhinitis, unspecified: Secondary | ICD-10-CM | POA: Diagnosis not present

## 2019-05-31 DIAGNOSIS — J3089 Other allergic rhinitis: Secondary | ICD-10-CM

## 2019-06-01 DIAGNOSIS — J301 Allergic rhinitis due to pollen: Secondary | ICD-10-CM

## 2019-06-03 ENCOUNTER — Ambulatory Visit (INDEPENDENT_AMBULATORY_CARE_PROVIDER_SITE_OTHER): Payer: No Typology Code available for payment source

## 2019-06-03 DIAGNOSIS — J309 Allergic rhinitis, unspecified: Secondary | ICD-10-CM | POA: Diagnosis not present

## 2019-06-10 ENCOUNTER — Ambulatory Visit (INDEPENDENT_AMBULATORY_CARE_PROVIDER_SITE_OTHER): Payer: No Typology Code available for payment source

## 2019-06-10 DIAGNOSIS — J309 Allergic rhinitis, unspecified: Secondary | ICD-10-CM | POA: Diagnosis not present

## 2019-06-17 ENCOUNTER — Ambulatory Visit (INDEPENDENT_AMBULATORY_CARE_PROVIDER_SITE_OTHER): Payer: No Typology Code available for payment source | Admitting: *Deleted

## 2019-06-17 DIAGNOSIS — J309 Allergic rhinitis, unspecified: Secondary | ICD-10-CM | POA: Diagnosis not present

## 2019-06-24 ENCOUNTER — Ambulatory Visit (INDEPENDENT_AMBULATORY_CARE_PROVIDER_SITE_OTHER): Payer: No Typology Code available for payment source

## 2019-06-24 DIAGNOSIS — J309 Allergic rhinitis, unspecified: Secondary | ICD-10-CM

## 2019-07-01 ENCOUNTER — Ambulatory Visit (INDEPENDENT_AMBULATORY_CARE_PROVIDER_SITE_OTHER): Payer: No Typology Code available for payment source

## 2019-07-01 DIAGNOSIS — J309 Allergic rhinitis, unspecified: Secondary | ICD-10-CM | POA: Diagnosis not present

## 2019-07-08 ENCOUNTER — Ambulatory Visit (INDEPENDENT_AMBULATORY_CARE_PROVIDER_SITE_OTHER): Payer: No Typology Code available for payment source

## 2019-07-08 DIAGNOSIS — J309 Allergic rhinitis, unspecified: Secondary | ICD-10-CM | POA: Diagnosis not present

## 2019-07-15 ENCOUNTER — Ambulatory Visit (INDEPENDENT_AMBULATORY_CARE_PROVIDER_SITE_OTHER): Payer: No Typology Code available for payment source | Admitting: *Deleted

## 2019-07-15 DIAGNOSIS — J309 Allergic rhinitis, unspecified: Secondary | ICD-10-CM | POA: Diagnosis not present

## 2019-07-19 ENCOUNTER — Encounter: Payer: Self-pay | Admitting: Allergy

## 2019-07-22 ENCOUNTER — Ambulatory Visit (INDEPENDENT_AMBULATORY_CARE_PROVIDER_SITE_OTHER): Payer: No Typology Code available for payment source

## 2019-07-22 ENCOUNTER — Encounter: Payer: Self-pay | Admitting: Allergy

## 2019-07-22 DIAGNOSIS — J309 Allergic rhinitis, unspecified: Secondary | ICD-10-CM | POA: Diagnosis not present

## 2019-07-29 ENCOUNTER — Encounter: Payer: Self-pay | Admitting: Allergy & Immunology

## 2019-07-29 ENCOUNTER — Ambulatory Visit (INDEPENDENT_AMBULATORY_CARE_PROVIDER_SITE_OTHER): Payer: No Typology Code available for payment source

## 2019-07-29 DIAGNOSIS — J309 Allergic rhinitis, unspecified: Secondary | ICD-10-CM

## 2019-08-05 ENCOUNTER — Ambulatory Visit (INDEPENDENT_AMBULATORY_CARE_PROVIDER_SITE_OTHER): Payer: No Typology Code available for payment source

## 2019-08-05 ENCOUNTER — Encounter: Payer: Self-pay | Admitting: Allergy

## 2019-08-05 DIAGNOSIS — J309 Allergic rhinitis, unspecified: Secondary | ICD-10-CM

## 2019-08-12 ENCOUNTER — Encounter: Payer: Self-pay | Admitting: Allergy

## 2019-08-12 ENCOUNTER — Ambulatory Visit (INDEPENDENT_AMBULATORY_CARE_PROVIDER_SITE_OTHER): Payer: No Typology Code available for payment source

## 2019-08-12 DIAGNOSIS — J309 Allergic rhinitis, unspecified: Secondary | ICD-10-CM | POA: Diagnosis not present

## 2019-08-19 ENCOUNTER — Ambulatory Visit (INDEPENDENT_AMBULATORY_CARE_PROVIDER_SITE_OTHER): Payer: No Typology Code available for payment source

## 2019-08-19 ENCOUNTER — Encounter: Payer: Self-pay | Admitting: Allergy

## 2019-08-19 DIAGNOSIS — J309 Allergic rhinitis, unspecified: Secondary | ICD-10-CM

## 2019-08-26 ENCOUNTER — Encounter: Payer: Self-pay | Admitting: Allergy

## 2019-08-26 ENCOUNTER — Ambulatory Visit (INDEPENDENT_AMBULATORY_CARE_PROVIDER_SITE_OTHER): Payer: No Typology Code available for payment source

## 2019-08-26 DIAGNOSIS — J309 Allergic rhinitis, unspecified: Secondary | ICD-10-CM

## 2019-08-26 NOTE — Progress Notes (Signed)
Exp 08/25/20

## 2019-09-02 ENCOUNTER — Ambulatory Visit (INDEPENDENT_AMBULATORY_CARE_PROVIDER_SITE_OTHER): Payer: No Typology Code available for payment source

## 2019-09-02 ENCOUNTER — Encounter: Payer: Self-pay | Admitting: Allergy

## 2019-09-02 DIAGNOSIS — J309 Allergic rhinitis, unspecified: Secondary | ICD-10-CM

## 2019-09-06 DIAGNOSIS — J3089 Other allergic rhinitis: Secondary | ICD-10-CM

## 2019-09-07 ENCOUNTER — Ambulatory Visit (INDEPENDENT_AMBULATORY_CARE_PROVIDER_SITE_OTHER): Payer: No Typology Code available for payment source

## 2019-09-07 ENCOUNTER — Encounter: Payer: Self-pay | Admitting: Allergy and Immunology

## 2019-09-07 DIAGNOSIS — J309 Allergic rhinitis, unspecified: Secondary | ICD-10-CM

## 2019-09-08 DIAGNOSIS — J301 Allergic rhinitis due to pollen: Secondary | ICD-10-CM

## 2019-09-16 ENCOUNTER — Ambulatory Visit (INDEPENDENT_AMBULATORY_CARE_PROVIDER_SITE_OTHER): Payer: No Typology Code available for payment source

## 2019-09-16 DIAGNOSIS — J309 Allergic rhinitis, unspecified: Secondary | ICD-10-CM | POA: Diagnosis not present

## 2019-09-23 ENCOUNTER — Ambulatory Visit (INDEPENDENT_AMBULATORY_CARE_PROVIDER_SITE_OTHER): Payer: No Typology Code available for payment source

## 2019-09-23 ENCOUNTER — Encounter: Payer: Self-pay | Admitting: Allergy

## 2019-09-23 DIAGNOSIS — J309 Allergic rhinitis, unspecified: Secondary | ICD-10-CM

## 2019-09-27 ENCOUNTER — Telehealth: Payer: Self-pay

## 2019-09-27 NOTE — Telephone Encounter (Signed)
I have placed a new auth request to the [facility] for a new auth to cover the patients injections and office visits. Patients auth Exp on 10/07/2019.  I tried to call the patient to schedule a follow up with Dr Nelva Bush but his number was inactive. When the patient comes in on Thursday for his shot we will get his number update and visit scheduled.

## 2019-09-30 ENCOUNTER — Encounter: Payer: Self-pay | Admitting: Allergy

## 2019-09-30 ENCOUNTER — Ambulatory Visit (INDEPENDENT_AMBULATORY_CARE_PROVIDER_SITE_OTHER): Payer: No Typology Code available for payment source

## 2019-09-30 DIAGNOSIS — J309 Allergic rhinitis, unspecified: Secondary | ICD-10-CM

## 2019-10-04 ENCOUNTER — Encounter (HOSPITAL_COMMUNITY): Payer: Self-pay | Admitting: *Deleted

## 2019-10-04 ENCOUNTER — Emergency Department (HOSPITAL_COMMUNITY)
Admission: EM | Admit: 2019-10-04 | Discharge: 2019-10-05 | Disposition: A | Discharge status: Home / Self Care | Payer: No Typology Code available for payment source | Attending: Emergency Medicine | Admitting: Emergency Medicine

## 2019-10-04 ENCOUNTER — Other Ambulatory Visit: Payer: Self-pay

## 2019-10-04 DIAGNOSIS — Z743 Need for continuous supervision: Secondary | ICD-10-CM | POA: Diagnosis not present

## 2019-10-04 DIAGNOSIS — R404 Transient alteration of awareness: Secondary | ICD-10-CM | POA: Diagnosis not present

## 2019-10-04 DIAGNOSIS — R3981 Functional urinary incontinence: Secondary | ICD-10-CM | POA: Diagnosis present

## 2019-10-04 DIAGNOSIS — R42 Dizziness and giddiness: Secondary | ICD-10-CM | POA: Diagnosis not present

## 2019-10-04 DIAGNOSIS — Z5321 Procedure and treatment not carried out due to patient leaving prior to being seen by health care provider: Secondary | ICD-10-CM | POA: Diagnosis not present

## 2019-10-04 DIAGNOSIS — R6889 Other general symptoms and signs: Secondary | ICD-10-CM | POA: Diagnosis not present

## 2019-10-04 LAB — CBC
HCT: 41.4 % (ref 39.0–52.0)
Hemoglobin: 13.2 g/dL (ref 13.0–17.0)
MCH: 31.7 pg (ref 26.0–34.0)
MCHC: 31.9 g/dL (ref 30.0–36.0)
MCV: 99.5 fL (ref 80.0–100.0)
Platelets: 170 10*3/uL (ref 150–400)
RBC: 4.16 MIL/uL — ABNORMAL LOW (ref 4.22–5.81)
RDW: 13.5 % (ref 11.5–15.5)
WBC: 10.4 10*3/uL (ref 4.0–10.5)
nRBC: 0 % (ref 0.0–0.2)

## 2019-10-04 LAB — BASIC METABOLIC PANEL
Anion gap: 8 (ref 5–15)
BUN: 20 mg/dL (ref 8–23)
CO2: 24 mmol/L (ref 22–32)
Calcium: 8 mg/dL — ABNORMAL LOW (ref 8.9–10.3)
Chloride: 110 mmol/L (ref 98–111)
Creatinine, Ser: 1.47 mg/dL — ABNORMAL HIGH (ref 0.61–1.24)
GFR calc Af Amer: 55 mL/min — ABNORMAL LOW (ref >60)
GFR calc non Af Amer: 47 mL/min — ABNORMAL LOW (ref >60)
Glucose, Bld: 124 mg/dL — ABNORMAL HIGH (ref 70–99)
Potassium: 3.9 mmol/L (ref 3.5–5.1)
Sodium: 142 mmol/L (ref 135–145)

## 2019-10-04 MED ORDER — SODIUM CHLORIDE 0.9% FLUSH: 3.0000 mL | Freq: Once | INTRAVENOUS | Status: DC

## 2019-10-04 NOTE — ED Triage Notes (Signed)
Pt arrived by [facility]. Family reported finding patient unresponsive in his recliner. Unable to wake him up x approx 2 mins and pt was cool and diaphoretic. On ems arrival, pt was awake and incontinent of urine. + orthostatics, bp 90/60 lying pta but drops to 60 SBP when sitting up. Received NS 1358ml bolus pta and still orthostatic.

## 2019-10-05 NOTE — ED Notes (Signed)
Pt states that he does not want to wait any longer, IV removed and pt left

## 2019-10-14 ENCOUNTER — Ambulatory Visit (INDEPENDENT_AMBULATORY_CARE_PROVIDER_SITE_OTHER): Payer: No Typology Code available for payment source

## 2019-10-14 ENCOUNTER — Encounter: Payer: Self-pay | Admitting: Allergy

## 2019-10-14 DIAGNOSIS — J309 Allergic rhinitis, unspecified: Secondary | ICD-10-CM

## 2019-10-28 ENCOUNTER — Ambulatory Visit (INDEPENDENT_AMBULATORY_CARE_PROVIDER_SITE_OTHER): Payer: No Typology Code available for payment source

## 2019-10-28 ENCOUNTER — Encounter: Payer: Self-pay | Admitting: Allergy

## 2019-10-28 DIAGNOSIS — J309 Allergic rhinitis, unspecified: Secondary | ICD-10-CM

## 2019-11-11 ENCOUNTER — Ambulatory Visit (INDEPENDENT_AMBULATORY_CARE_PROVIDER_SITE_OTHER): Payer: No Typology Code available for payment source

## 2019-11-11 ENCOUNTER — Encounter: Payer: Self-pay | Admitting: Allergy

## 2019-11-11 DIAGNOSIS — J309 Allergic rhinitis, unspecified: Secondary | ICD-10-CM | POA: Diagnosis not present

## 2019-11-24 DIAGNOSIS — J301 Allergic rhinitis due to pollen: Secondary | ICD-10-CM

## 2019-11-24 NOTE — Progress Notes (Signed)
VIALS EXP 11-23-20 

## 2019-11-25 ENCOUNTER — Encounter: Payer: Self-pay | Admitting: Allergy

## 2019-11-25 ENCOUNTER — Ambulatory Visit: Payer: Self-pay

## 2019-11-25 ENCOUNTER — Ambulatory Visit (INDEPENDENT_AMBULATORY_CARE_PROVIDER_SITE_OTHER): Payer: No Typology Code available for payment source | Admitting: Allergy

## 2019-11-25 ENCOUNTER — Other Ambulatory Visit: Payer: Self-pay

## 2019-11-25 VITALS — BP 130/82 | HR 65 | Temp 98.1°F | Resp 16 | Ht 72.0 in | Wt 191.2 lb

## 2019-11-25 DIAGNOSIS — J309 Allergic rhinitis, unspecified: Secondary | ICD-10-CM

## 2019-11-25 DIAGNOSIS — J3089 Other allergic rhinitis: Secondary | ICD-10-CM | POA: Diagnosis not present

## 2019-11-25 DIAGNOSIS — H1013 Acute atopic conjunctivitis, bilateral: Secondary | ICD-10-CM | POA: Diagnosis not present

## 2019-11-25 DIAGNOSIS — J449 Chronic obstructive pulmonary disease, unspecified: Secondary | ICD-10-CM | POA: Diagnosis not present

## 2019-11-25 MED ORDER — OLOPATADINE HCL 0.2 % OP SOLN: OPHTHALMIC | 5 refills | Status: DC

## 2019-11-25 NOTE — Patient Instructions (Addendum)
Allergic rhinitis with conjunctivitis  - continue avoidance measures for grasses, trees, weeds, dust mites, molds  - take either Allegra or Xyzal for your long-acting antihistamine  - continue Singulair 10mg  daily  - use Olopatadine 0.2% 1 drop each eye daily as needed for water eyes.    - continue to use your current lubricating eye drop to help manage dry eye  - continue allergen immunotherapy (allergy shots) per schedule.  You are at maintenance dosing.  Will reassess symptoms once you have been a maintenance dosing for a full year (summer 2022).  - Azelastine and Atrovent nasal sprays have not been effective in controlling nasal drainage  COPD  - continue recommendation per your [facility] Allergist with use of Tiotropium 2 puffs daily  - have access to albuterol inhaler 2 puffs every 4-6 hours as needed for cough/wheeze/shortness of breath/chest tightness.  May use 15-20 minutes prior to activity.   Monitor frequency of use.    Follow-up 6 months or sooner if needed

## 2019-11-25 NOTE — Progress Notes (Signed)
Follow-up Note  RE: WILBER FINI MRN: [id number] DOB: 06-09-1945 Date of Office Visit: 11/25/2019   History of present illness: Antonio Edwards is a 74 y.o. male presenting today for follow-up of allergic rhinitis with conjunctivitis.  He also has COPD and follows with allergist at [facility].  He was last seen in the office on 03/31/19 by myself.  He is on allergen immunotherapy and reached full dose maintenance dosing this summer.  He states he can not tell a different yet in symptoms with the immunotherapy yet.  He reports having a lot of watery eyes and nasal drainage.  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  He states is not currently taking an antihistamine.  But he states he takes a lot of medication in the morning and at night and he is not sure what all he is taking.  He states he is not using any nasal sprays.  The last 3 sprays he has tried he states none of them worked for his nasal drainage control.   He denies any albuterol use.  He also reports not using tiotropium inhaler either.  He does report having wheezing at night.    Review of systems: Review of Systems  Constitutional: Negative.   HENT:       See HPI  Eyes:       See HPI  Respiratory:       See HPI  Cardiovascular: Negative.   Gastrointestinal: Negative.   Musculoskeletal: Negative.   Skin: Negative.   Neurological: Negative.     All other systems negative unless noted above in HPI  Past medical/social/surgical/family history have been reviewed and are unchanged unless specifically indicated below.  No changes  Medication List: Current Outpatient Medications  Medication Sig Dispense Refill  . albuterol (VENTOLIN HFA) 108 (90 Base) MCG/ACT inhaler Inhale 2 puffs into the lungs every 6 (six) hours as needed for wheezing or shortness of breath.    . alprostadil (MUSE) 125 MCG pellet 125 mcg by Transurethral route as needed for erectile dysfunction. use no more than 3 times per  week    . aspirin EC 81 MG tablet Take 81 mg by mouth daily.    Marland Kitchen atorvastatin (LIPITOR) 20 MG tablet Take 60 mg by mouth daily.    . Calcium Carbonate-Vitamin D3 (CALCIUM 600-D) 600-400 MG-UNIT TABS Take 1 tablet by mouth 2 (two) times daily.    . cetirizine (ZYRTEC) 10 MG tablet Take 10 mg by mouth daily.    . citalopram (CELEXA) 40 MG tablet Take 40 mg by mouth daily.    Marland Kitchen EPINEPHrine 0.3 mg/0.3 mL IJ SOAJ injection Inject 0.3 mLs (0.3 mg total) into the muscle as needed for anaphylaxis. 2 each 1  . GLYCERIN-HYPROMELLOSE-PEG 400 OP Apply 1 drop to eye 2 (two) times daily.    Marland Kitchen ibuprofen (ADVIL) 600 MG tablet Take 600 mg by mouth 3 (three) times daily as needed.    Marland Kitchen ketoconazole (NIZORAL) 2 % shampoo Apply 1 application topically 3 (three) times a week.    . levocetirizine (XYZAL) 5 MG tablet Take 1 tablet (5 mg total) by mouth every evening. 90 tablet 2  . levothyroxine (SYNTHROID) 75 MCG tablet Take 75 mcg by mouth daily before breakfast.    . memantine (NAMENDA) 10 MG tablet Take 20 mg by mouth at bedtime.    . montelukast (SINGULAIR) 10 MG tablet Take 10 mg by mouth at bedtime.    Marland Kitchen  rivastigmine (EXELON) 6 MG capsule Take 6 mg by mouth 2 (two) times daily.    . sildenafil (VIAGRA) 100 MG tablet Take 100 mg by mouth daily as needed for erectile dysfunction.    . Tiotropium Bromide Monohydrate 2.5 MCG/ACT AERS Inhale 2 puffs into the lungs daily.    . Vitamin D, Cholecalciferol, 50 MCG (2000 UT) CAPS Take 1 tablet by mouth daily.    . Olopatadine HCl 0.2 % SOLN 1 drop daily as needed 2.5 mL 5   No current facility-administered medications for this visit.     Known medication allergies: Allergies  Allergen Reactions  . Alendronate Other (See Comments)    Red all over, skin begun falling off as if he had been burned  . Latex Hives  . Aricept [Donepezil Hcl] Other (See Comments)    Loss of taste     Physical examination: Blood pressure 130/82, pulse 65, temperature 98.1 F (36.7  C), temperature source Temporal, resp. rate 16, height 6' (1.829 m), weight 191 lb 3.2 oz (86.7 kg), SpO2 96 %.  General: Alert, interactive, in no acute distress. HEENT: PERRLA, TMs pearly gray, turbinates minimally edematous with clear discharge, post-pharynx non erythematous. Neck: Supple without lymphadenopathy. Lungs: Clear to auscultation without wheezing, rhonchi or rales. {no increased work of breathing. CV: Normal S1, S2 without murmurs. Abdomen: Nondistended, nontender. Skin: Warm and dry, without lesions or rashes. Extremities:  No clubbing, cyanosis or edema. Neuro:   Grossly intact.  Diagnositics/Labs: Allergen immunotherapy given today  Assessment and plan:   Allergic rhinitis with conjunctivitis  - continue avoidance measures for grasses, trees, weeds, dust mites, molds  - take either Allegra or Xyzal for your long-acting antihistamine  - continue Singulair 10mg  daily  - use Olopatadine 0.2% 1 drop each eye daily as needed for water eyes.    - continue to use your current lubricating eye drop to help manage dry eye  - continue allergen immunotherapy (allergy shots) per schedule.  You are at maintenance dosing.  Will reassess symptoms once you have been a maintenance dosing for a full year (summer 2022).  - Azelastine and Atrovent nasal sprays have not been effective in controlling nasal drainage  COPD  - resume use of Tiotropium 2 puffs daily  - have access to albuterol inhaler 2 puffs every 4-6 hours as needed for cough/wheeze/shortness of breath/chest tightness.  May use 15-20 minutes prior to activity.   Monitor frequency of use.    Follow-up 6 months or sooner if needed I appreciate the opportunity to take part in [place] care. Please do not hesitate to contact me with questions.  Sincerely,   Prudy Feeler, MD Allergy/Immunology [facility] [facility] [place]

## 2019-11-29 DIAGNOSIS — J3089 Other allergic rhinitis: Secondary | ICD-10-CM

## 2019-12-09 ENCOUNTER — Ambulatory Visit (INDEPENDENT_AMBULATORY_CARE_PROVIDER_SITE_OTHER): Payer: No Typology Code available for payment source

## 2019-12-09 DIAGNOSIS — J309 Allergic rhinitis, unspecified: Secondary | ICD-10-CM

## 2019-12-23 ENCOUNTER — Ambulatory Visit (INDEPENDENT_AMBULATORY_CARE_PROVIDER_SITE_OTHER): Payer: No Typology Code available for payment source

## 2019-12-23 ENCOUNTER — Encounter: Payer: Self-pay | Admitting: Allergy & Immunology

## 2019-12-23 DIAGNOSIS — J309 Allergic rhinitis, unspecified: Secondary | ICD-10-CM | POA: Diagnosis not present

## 2020-01-06 ENCOUNTER — Encounter: Payer: Self-pay | Admitting: Allergy

## 2020-01-06 ENCOUNTER — Ambulatory Visit (INDEPENDENT_AMBULATORY_CARE_PROVIDER_SITE_OTHER): Payer: No Typology Code available for payment source | Admitting: *Deleted

## 2020-01-06 DIAGNOSIS — J309 Allergic rhinitis, unspecified: Secondary | ICD-10-CM

## 2020-01-13 ENCOUNTER — Encounter: Payer: Self-pay | Admitting: Allergy

## 2020-01-13 ENCOUNTER — Ambulatory Visit (INDEPENDENT_AMBULATORY_CARE_PROVIDER_SITE_OTHER): Payer: No Typology Code available for payment source | Admitting: *Deleted

## 2020-01-13 DIAGNOSIS — J309 Allergic rhinitis, unspecified: Secondary | ICD-10-CM

## 2020-01-20 ENCOUNTER — Encounter: Payer: Self-pay | Admitting: Allergy

## 2020-01-20 ENCOUNTER — Ambulatory Visit (INDEPENDENT_AMBULATORY_CARE_PROVIDER_SITE_OTHER): Payer: No Typology Code available for payment source

## 2020-01-20 DIAGNOSIS — J309 Allergic rhinitis, unspecified: Secondary | ICD-10-CM | POA: Diagnosis not present

## 2020-01-27 ENCOUNTER — Ambulatory Visit (INDEPENDENT_AMBULATORY_CARE_PROVIDER_SITE_OTHER): Payer: No Typology Code available for payment source

## 2020-01-27 DIAGNOSIS — J309 Allergic rhinitis, unspecified: Secondary | ICD-10-CM | POA: Diagnosis not present

## 2020-02-03 ENCOUNTER — Encounter: Payer: Self-pay | Admitting: Allergy

## 2020-02-03 ENCOUNTER — Ambulatory Visit (INDEPENDENT_AMBULATORY_CARE_PROVIDER_SITE_OTHER): Payer: No Typology Code available for payment source | Admitting: *Deleted

## 2020-02-03 DIAGNOSIS — J309 Allergic rhinitis, unspecified: Secondary | ICD-10-CM

## 2020-02-17 ENCOUNTER — Encounter: Payer: Self-pay | Admitting: Allergy

## 2020-02-17 ENCOUNTER — Ambulatory Visit (INDEPENDENT_AMBULATORY_CARE_PROVIDER_SITE_OTHER): Payer: No Typology Code available for payment source

## 2020-02-17 DIAGNOSIS — J309 Allergic rhinitis, unspecified: Secondary | ICD-10-CM

## 2020-03-02 ENCOUNTER — Ambulatory Visit (INDEPENDENT_AMBULATORY_CARE_PROVIDER_SITE_OTHER): Payer: No Typology Code available for payment source

## 2020-03-02 ENCOUNTER — Encounter: Payer: Self-pay | Admitting: Allergy

## 2020-03-02 DIAGNOSIS — J309 Allergic rhinitis, unspecified: Secondary | ICD-10-CM

## 2020-03-30 ENCOUNTER — Encounter: Payer: Self-pay | Admitting: Allergy

## 2020-03-30 ENCOUNTER — Ambulatory Visit (INDEPENDENT_AMBULATORY_CARE_PROVIDER_SITE_OTHER): Payer: No Typology Code available for payment source | Admitting: *Deleted

## 2020-03-30 DIAGNOSIS — J309 Allergic rhinitis, unspecified: Secondary | ICD-10-CM | POA: Diagnosis not present

## 2020-04-04 DIAGNOSIS — J301 Allergic rhinitis due to pollen: Secondary | ICD-10-CM | POA: Diagnosis not present

## 2020-04-04 NOTE — Progress Notes (Signed)
VIALS EXP 04-04-21 

## 2020-04-05 DIAGNOSIS — J3089 Other allergic rhinitis: Secondary | ICD-10-CM | POA: Diagnosis not present

## 2020-04-13 ENCOUNTER — Encounter: Payer: Self-pay | Admitting: Allergy

## 2020-04-13 ENCOUNTER — Telehealth: Payer: Self-pay | Admitting: *Deleted

## 2020-04-13 ENCOUNTER — Ambulatory Visit (INDEPENDENT_AMBULATORY_CARE_PROVIDER_SITE_OTHER): Payer: No Typology Code available for payment source | Admitting: *Deleted

## 2020-04-13 DIAGNOSIS — J309 Allergic rhinitis, unspecified: Secondary | ICD-10-CM

## 2020-04-13 NOTE — Telephone Encounter (Signed)
Left message for pt to return call.

## 2020-04-13 NOTE — Telephone Encounter (Signed)
Thanks for the date.  Will await the fax.  You can let him know however that he is only 2 years into the immunotherapy process that is meant to be a 3 to 5-year process.  Thus it is likely that it just has not been enough time in the maintenance phase to see any differences yet.  I would continue the course for now.  If once he is solidly into year 3+ if he is still not seeing any differences then he may not be a responder.

## 2020-04-13 NOTE — Telephone Encounter (Signed)
Pt came in for allergy injections today and stated that he saw the [place] last week because he is still having sinus issues and feels like the allergy injections are not helping. He told me that the [place] mentioned that they wanted to start him on "another shot" of some sort but he could not remember the name of it. Nori said the [place] was suppose to be faxing [place] notes from that visit. Just FYI.

## 2020-04-19 NOTE — Telephone Encounter (Signed)
[facility] as of today.

## 2020-04-20 NOTE — Telephone Encounter (Signed)
I am confused.  He is already on allergen immunotherapy for his allergic rhinitis.    Does he just need a follow-up appointment?

## 2020-04-20 NOTE — Telephone Encounter (Signed)
Patient is scheduled with Althea Charon on 04/25/2020 at 9 AM.

## 2020-04-20 NOTE — Telephone Encounter (Addendum)
Patient brought it lab results from the [place] which has been placed in your office. The [facility] sent a new referral stating patient has severe allergic rhinitis and requests/ recommends patient start a new biologic.   Please advise.

## 2020-04-21 NOTE — Telephone Encounter (Signed)
Noted! Thank you

## 2020-04-23 NOTE — Patient Instructions (Incomplete)
Allergic rhinitis with conjunctivitis Start saline nasal rinses 1-2 times a day as needed for nasal symptoms. DO NOT use well water. Buy distilled water.  Use this prior to any medicated nasal sprays Continue avoidance measures for grass, trees, weeds, dust mite, and mold Stop loratadine (Claritin) Start cetirizine (Zyrtec) 10 mg once a day as needed for runny nose or itching Continue Singulair 10 mg once a day  Continue olopatadine 0.2% 1 drop each eye once a day as needed for itchy watery eyes Continue current eye lubricating eye drop to help manage dry eyes Continue allergy injections per protocol  COPD Re-start tiotropium 2 puffs once a day to help prevent cough and wheeze. Please speak with your physician at the [facility] about not feeling that this medication is helpful Continue albuterol 2 puffs every 4-6 hours as needed for cough, wheeze, tightness in chest or shortness of breath. Also, may use albuterol 2 puffs 5-15 minutes prior to exercise.  Smoking cessation Information given about smoking cessation  Please schedule an appointment with your primary care physician to discuss your low heart rate Please let us know if this treatment plan is not working well for you. Schedule a follow up appointment in 3 months

## 2020-04-25 ENCOUNTER — Encounter: Payer: Self-pay | Admitting: Family

## 2020-04-25 ENCOUNTER — Other Ambulatory Visit: Payer: Self-pay

## 2020-04-25 ENCOUNTER — Ambulatory Visit (INDEPENDENT_AMBULATORY_CARE_PROVIDER_SITE_OTHER): Payer: No Typology Code available for payment source | Admitting: Family

## 2020-04-25 VITALS — BP 144/80 | HR 50 | Temp 97.9°F | Resp 14 | Ht 72.0 in | Wt 193.4 lb

## 2020-04-25 DIAGNOSIS — J309 Allergic rhinitis, unspecified: Secondary | ICD-10-CM

## 2020-04-25 DIAGNOSIS — J449 Chronic obstructive pulmonary disease, unspecified: Secondary | ICD-10-CM

## 2020-04-25 DIAGNOSIS — Z720 Tobacco use: Secondary | ICD-10-CM

## 2020-04-25 MED ORDER — MONTELUKAST SODIUM 10 MG PO TABS: 10.0000 mg | ORAL_TABLET | Freq: Every day | ORAL | 5 refills | Status: AC

## 2020-04-25 MED ORDER — OLOPATADINE HCL 0.2 % OP SOLN: OPHTHALMIC | 5 refills | Status: DC

## 2020-04-25 MED ORDER — CETIRIZINE HCL 10 MG PO TABS: 10.0000 mg | ORAL_TABLET | Freq: Every day | ORAL | 5 refills | Status: AC

## 2020-04-25 NOTE — Progress Notes (Signed)
[place] [address] Dept: [phone]  FOLLOW UP NOTE  Patient ID: Antonio Edwards, male    DOB: [birth year]  Age: 75 y.o. MRN: [id number] Date of Office Visit: 04/25/2020  Assessment  Chief Complaint: Allergic Rhinitis  (Says he cant tell a difference. Eyes still water, post nasal drip. Night time is worse.Says it's been this way since he's been on allergy injections.)  HPI Antonio Edwards is a 75 year old male who presents today for follow-up of allergic rhinitis with conjunctivitis.  He also has COPD and follows with an allergist at the [place].  He was last seen on November 25, 2019 by Dr. Nelva Bush.  Allergic rhinitis with conjunctivitis is reported as not well controlled with loratadine 10 mg once a day, Singulair 10 mg once a day, and olopatadine 0.2% eyedrops once a day as needed.  He is also receiving allergen immunotherapy every 2 weeks.  He does not feel that these allergy injections are helping.  He does deny large local reactions.  He reports clear thick white rhinorrhea, watery eyes, nasal congestion, and postnasal drip that is worse at night.  He also reports sinus tenderness at times, but not very often.  He has used azelastine and Atrovent nasal spray in the past and they have not been effective in controlling his nasal drainage.  He reports that he had an appointment at the [facility] recently and thinks that they are going to do another allergy test in the future, but he is not sure.  He also had some lab work that he thought might show infection.  After reviewing his lab work from April 04, 2020 from the [place] his CBC did not show any infection.  His serum IgE was 9689.  He is currently not using his tiotropium inhaler or albuterol inhaler.  He reports that he did not feel like the tiotropium was helping.  Urged him to speak with his physician at the [place] to discuss other options.  He reports coughing, wheezing, and shortness of breath at night when his sinuses are  draining.  He reports that he does have an appointment with the [place] for a breathing test.  He continues to smoke cigarettes half a pack a day and reports that he has been smoking since about the age of 32.   Drug Allergies:  Allergies  Allergen Reactions  . Alendronate Other (See Comments)    Red all over, skin begun falling off as if he had been burned  . Latex Hives  . Aricept [Donepezil Hcl] Other (See Comments)    Loss of taste    Review of Systems: Review of Systems  Constitutional: Negative for chills and fever.  HENT:       Reports post nasal drip that is worse at night, nasal congestion, and thick white rhinorreha  Eyes:       Reports watery eyes, but denies itchy eyes  Respiratory: Positive for cough, shortness of breath and wheezing.   Cardiovascular: Negative for chest pain and palpitations.  Gastrointestinal: Negative for abdominal pain and heartburn.  Genitourinary: Negative for dysuria.  Skin: Negative for itching and rash.  Neurological: Negative for headaches.  Endo/Heme/Allergies: Positive for environmental allergies.    Physical Exam: BP (!) 144/80   Pulse (!) 50   Temp 97.9 F (36.6 C)   Resp 14   Ht 6' (1.829 m)   Wt 193 lb 6.4 oz (87.7 kg)   SpO2 97%   BMI 26.23 kg/m  Physical Exam HENT:     Head: Normocephalic and atraumatic.     Comments: Pharynx normal, eyes normal, ears normal, nose lateral lower turbinates moderately edematous and slightly erythematous with no drainage noted    Right Ear: Tympanic membrane, ear canal and external ear normal.     Left Ear: Tympanic membrane, ear canal and external ear normal.     Mouth/Throat:     Mouth: Mucous membranes are moist.     Pharynx: Oropharynx is clear.  Eyes:     Conjunctiva/sclera: Conjunctivae normal.  Cardiovascular:     Rate and Rhythm: Regular rhythm. Bradycardia present.     Heart sounds: Normal heart sounds.  Pulmonary:     Effort: Pulmonary effort is normal.     Breath sounds:  Normal breath sounds.     Comments: Lungs clear to auscultation Musculoskeletal:     Cervical back: Neck supple.  Skin:    General: Skin is warm.  Neurological:     Mental Status: He is alert and oriented to person, place, and time.  Psychiatric:        Mood and Affect: Mood normal.        Behavior: Behavior normal.        Thought Content: Thought content normal.        Judgment: Judgment normal.     Diagnostics:  none  Assessment and Plan: 1. Allergic rhinitis, unspecified seasonality, unspecified trigger   2. Chronic obstructive pulmonary disease, unspecified COPD type ([place])   3. Tobacco use     No orders of the defined types were placed in this encounter.   Patient Instructions  Allergic rhinitis with conjunctivitis Start saline nasal rinses 1-2 times a day as needed for nasal symptoms. DO NOT use well water. Buy distilled water.  Use this prior to any medicated nasal sprays Continue avoidance measures for grass, trees, weeds, dust mite, and mold Stop loratadine (Claritin) Start cetirizine (Zyrtec) 10 mg once a day as needed for runny nose or itching Continue Singulair 10 mg once a day  Continue olopatadine 0.2% 1 drop each eye once a day as needed for itchy watery eyes Continue current eye lubricating eye drop to help manage dry eyes Continue allergy injections per protocol  COPD Re-start tiotropium 2 puffs once a day to help prevent cough and wheeze. Please speak with your physician at the [facility] about not feeling that this medication is helpful Continue albuterol 2 puffs every 4-6 hours as needed for cough, wheeze, tightness in chest or shortness of breath. Also, may use albuterol 2 puffs 5-15 minutes prior to exercise.  Smoking cessation Information given about smoking cessation  Please schedule an appointment with your primary care physician to discuss your low heart rate Please let us know if this treatment plan is not working well for you. Schedule a follow up  appointment in 3 months    Return in about 3 months (around 07/23/2020), or if symptoms worsen or fail to improve.    Thank you for the opportunity to care for this patient.  Please do not hesitate to contact me with questions.  Althea Charon, FNP Allergy and [facility] [place] [facility] [place]

## 2020-04-26 ENCOUNTER — Other Ambulatory Visit: Payer: Self-pay

## 2020-04-26 MED ORDER — OLOPATADINE HCL 0.1 % OP SOLN: 1.0000 [drp] | Freq: Two times a day (BID) | OPHTHALMIC | 5 refills | Status: AC

## 2020-04-26 NOTE — Telephone Encounter (Signed)
Olopatadine 0.2 % not covered by patient's insurance and is requested Olopatadine 0.1 % instead which was sent to [facility].

## 2020-05-11 ENCOUNTER — Ambulatory Visit (INDEPENDENT_AMBULATORY_CARE_PROVIDER_SITE_OTHER): Payer: No Typology Code available for payment source

## 2020-05-11 ENCOUNTER — Encounter: Payer: Self-pay | Admitting: Allergy

## 2020-05-11 DIAGNOSIS — J309 Allergic rhinitis, unspecified: Secondary | ICD-10-CM | POA: Diagnosis not present

## 2020-05-24 ENCOUNTER — Encounter: Payer: Self-pay | Admitting: Allergy

## 2020-05-24 ENCOUNTER — Ambulatory Visit (INDEPENDENT_AMBULATORY_CARE_PROVIDER_SITE_OTHER): Payer: No Typology Code available for payment source

## 2020-05-24 DIAGNOSIS — J309 Allergic rhinitis, unspecified: Secondary | ICD-10-CM

## 2020-06-01 ENCOUNTER — Encounter: Payer: Self-pay | Admitting: Allergy

## 2020-06-01 ENCOUNTER — Ambulatory Visit (INDEPENDENT_AMBULATORY_CARE_PROVIDER_SITE_OTHER): Payer: No Typology Code available for payment source | Admitting: *Deleted

## 2020-06-01 DIAGNOSIS — J309 Allergic rhinitis, unspecified: Secondary | ICD-10-CM

## 2020-06-08 ENCOUNTER — Ambulatory Visit (INDEPENDENT_AMBULATORY_CARE_PROVIDER_SITE_OTHER): Payer: No Typology Code available for payment source | Admitting: *Deleted

## 2020-06-08 ENCOUNTER — Encounter: Payer: Self-pay | Admitting: Allergy

## 2020-06-08 DIAGNOSIS — J309 Allergic rhinitis, unspecified: Secondary | ICD-10-CM

## 2020-06-14 ENCOUNTER — Encounter: Payer: Self-pay | Admitting: Allergy

## 2020-06-14 ENCOUNTER — Ambulatory Visit (INDEPENDENT_AMBULATORY_CARE_PROVIDER_SITE_OTHER): Payer: No Typology Code available for payment source | Admitting: *Deleted

## 2020-06-14 DIAGNOSIS — J309 Allergic rhinitis, unspecified: Secondary | ICD-10-CM | POA: Diagnosis not present

## 2020-06-21 ENCOUNTER — Encounter: Payer: Self-pay | Admitting: Allergy

## 2020-06-21 ENCOUNTER — Ambulatory Visit (INDEPENDENT_AMBULATORY_CARE_PROVIDER_SITE_OTHER): Payer: No Typology Code available for payment source

## 2020-06-21 DIAGNOSIS — J309 Allergic rhinitis, unspecified: Secondary | ICD-10-CM

## 2020-07-06 ENCOUNTER — Ambulatory Visit (INDEPENDENT_AMBULATORY_CARE_PROVIDER_SITE_OTHER): Payer: No Typology Code available for payment source | Admitting: *Deleted

## 2020-07-06 ENCOUNTER — Encounter: Payer: Self-pay | Admitting: Allergy

## 2020-07-06 DIAGNOSIS — J309 Allergic rhinitis, unspecified: Secondary | ICD-10-CM

## 2020-07-20 ENCOUNTER — Ambulatory Visit (INDEPENDENT_AMBULATORY_CARE_PROVIDER_SITE_OTHER): Payer: No Typology Code available for payment source | Admitting: *Deleted

## 2020-07-20 ENCOUNTER — Encounter: Payer: Self-pay | Admitting: Allergy

## 2020-07-20 DIAGNOSIS — J309 Allergic rhinitis, unspecified: Secondary | ICD-10-CM | POA: Diagnosis not present

## 2020-07-27 ENCOUNTER — Other Ambulatory Visit: Payer: Self-pay

## 2020-07-27 ENCOUNTER — Encounter: Payer: Self-pay | Admitting: Allergy

## 2020-07-27 ENCOUNTER — Ambulatory Visit (INDEPENDENT_AMBULATORY_CARE_PROVIDER_SITE_OTHER): Payer: No Typology Code available for payment source | Admitting: Allergy

## 2020-07-27 VITALS — BP 112/62 | HR 58 | Temp 98.1°F | Resp 14 | Ht 72.0 in | Wt 181.4 lb

## 2020-07-27 DIAGNOSIS — J3089 Other allergic rhinitis: Secondary | ICD-10-CM | POA: Diagnosis not present

## 2020-07-27 DIAGNOSIS — J449 Chronic obstructive pulmonary disease, unspecified: Secondary | ICD-10-CM | POA: Diagnosis not present

## 2020-07-27 DIAGNOSIS — H1013 Acute atopic conjunctivitis, bilateral: Secondary | ICD-10-CM | POA: Diagnosis not present

## 2020-07-27 MED ORDER — OLOPATADINE HCL 0.6 % NA SOLN
2.0000 | Freq: Two times a day (BID) | NASAL | 5 refills | Status: DC
Start: 2020-07-27 — End: 2020-08-01

## 2020-07-27 NOTE — Patient Instructions (Addendum)
Allergic rhinitis with conjunctivitis  - continue avoidance measures for grasses, trees, weeds, dust mites, molds  - take antihistamine like Zyrtec, Xyzal or Allegra daily  - continue Singulair 10mg  daily  -try nasal antihistamine Patanase 2 sprays each nostril twice a day.  This is a nasal spray for nasal drainage.  If this spray is not effective for nasal drainage then will recommend you see ENT again.   You have not seen any effect with use of Astelin or Atrovent nasal sprays  - use Olopatadine 0.2% 1 drop each eye daily as needed for water eyes.    - continue to use your current lubricating eye drop to help manage dry eye  - continue allergen immunotherapy (allergy shots) per schedule.  You are at maintenance dosing.  Lets get through 2 years and see how you are in the winter.    COPD  - use of Tiotropium 2 puffs daily  - have access to albuterol inhaler 2 puffs every 4-6 hours as needed for cough/wheeze/shortness of breath/chest tightness.  May use 15-20 minutes prior to activity.   Monitor frequency of use.    Follow-up 6 months or sooner if needed

## 2020-07-27 NOTE — Progress Notes (Signed)
Follow-up Note  RE: Antonio Edwards MRN: [id number] DOB: 06-10-1945 Date of Office Visit: 07/27/2020   History of present illness: Antonio Edwards is a 75 y.o. male presenting today for follow-up of allergic rhinitis with conjunctivitis and COPD.  He was last seen in the office on 04/25/2020 by nurse practitioner Quita Skye. He states his allergies are still acting up and he is having a lot of nasal drainage.  He does not feel any of his allergy based medications or allergy shots have helped. He states the right side is the worse and his right eye waters all the time.  He tried the nasal saline rinse and states for 3 days later he was tasting a saltiness in his mouth.  He does not want to do any further nasal saline rinse.  He is taking zyrtec antihistamine daily and singulair.  Currently not using any nasal sprays.  He is on immunotherapy at maintenance dosing and tolerating well without large local or systemic reactions.  He states his COPD has been doing well an denies any symptoms at this time.  He continues to smoke and has no desire to cut back.  He states will use the tiotropium as needed here and there.  He reports has not had any symptoms to warrant use of albuterol inhaler.  Review of systems: Review of Systems  Constitutional: Negative.   HENT:       See HPI  Eyes:       See HPI  Respiratory: Negative.   Cardiovascular: Negative.   Gastrointestinal: Negative.   Musculoskeletal: Negative.   Skin: Negative.   Neurological: Negative.     All other systems negative unless noted above in HPI  Past medical/social/surgical/family history have been reviewed and are unchanged unless specifically indicated below.  No changes  Medication List: Current Outpatient Medications  Medication Sig Dispense Refill  . albuterol (VENTOLIN HFA) 108 (90 Base) MCG/ACT inhaler Inhale 2 puffs into the lungs every 6 (six) hours as needed for wheezing or shortness of breath.    . alprostadil (MUSE)  125 MCG pellet 125 mcg by Transurethral route as needed for erectile dysfunction. use no more than 3 times per week    . aspirin EC 81 MG tablet Take 81 mg by mouth daily.    Marland Kitchen atorvastatin (LIPITOR) 20 MG tablet Take 60 mg by mouth daily.    . Calcium Carbonate-Vitamin D3 600-400 MG-UNIT TABS Take 1 tablet by mouth 2 (two) times daily.    . cetirizine (ZYRTEC) 10 MG tablet Take 1 tablet (10 mg total) by mouth daily. 30 tablet 5  . citalopram (CELEXA) 40 MG tablet Take 40 mg by mouth daily.    Marland Kitchen GLYCERIN-HYPROMELLOSE-PEG 400 OP Apply 1 drop to eye 2 (two) times daily.    Marland Kitchen ibuprofen (ADVIL) 600 MG tablet Take 600 mg by mouth 3 (three) times daily as needed.    Marland Kitchen ketoconazole (NIZORAL) 2 % shampoo Apply 1 application topically 3 (three) times a week.    . levocetirizine (XYZAL) 5 MG tablet Take 1 tablet (5 mg total) by mouth every evening. 90 tablet 2  . levothyroxine (SYNTHROID) 75 MCG tablet Take 75 mcg by mouth daily before breakfast.    . memantine (NAMENDA) 10 MG tablet Take 20 mg by mouth at bedtime.    . montelukast (SINGULAIR) 10 MG tablet Take 1 tablet (10 mg total) by mouth at bedtime. 30 tablet 5  . olopatadine (PATANOL) 0.1 % ophthalmic solution Place 1 drop  into both eyes 2 (two) times daily. 5 mL 5  . Olopatadine HCl 0.6 % SOLN Place 2 sprays into the nose in the morning and at bedtime. 30.5 g 5  . rivastigmine (EXELON) 6 MG capsule Take 6 mg by mouth 2 (two) times daily.    . Vitamin D, Cholecalciferol, 50 MCG (2000 UT) CAPS Take 1 tablet by mouth daily.    Marland Kitchen EPINEPHrine 0.3 mg/0.3 mL IJ SOAJ injection Inject 0.3 mLs (0.3 mg total) into the muscle as needed for anaphylaxis. (Patient not taking: Reported on 07/27/2020) 2 each 1  . sildenafil (VIAGRA) 100 MG tablet Take 100 mg by mouth daily as needed for erectile dysfunction. (Patient not taking: Reported on 07/27/2020)    . Tiotropium Bromide Monohydrate 2.5 MCG/ACT AERS Inhale 2 puffs into the lungs daily. (Patient not taking:  Reported on 07/27/2020)     No current facility-administered medications for this visit.     Known medication allergies: Allergies  Allergen Reactions  . Alendronate Other (See Comments)    Red all over, skin begun falling off as if he had been burned  . Latex Hives  . Aricept [Donepezil Hcl] Other (See Comments)    Loss of taste     Physical examination: Blood pressure 112/62, pulse (!) 58, temperature 98.1 F (36.7 C), resp. rate 14, height 6' (1.829 m), weight 181 lb 6.4 oz (82.3 kg), SpO2 95 %.  General: Alert, interactive, in no acute distress. HEENT: PERRLA, TMs pearly gray, turbinates minimally edematous with clear discharge, post-pharynx non erythematous. Neck: Supple without lymphadenopathy. Lungs: Clear to auscultation without wheezing, rhonchi or rales. {no increased work of breathing. CV: Normal S1, S2 without murmurs. Abdomen: Nondistended, nontender. Skin: Warm and dry, without lesions or rashes. Extremities:  No clubbing, cyanosis or edema. Neuro:   Grossly intact.  Diagnositics/Labs: None today  Assessment and plan:   Allergic rhinitis with conjunctivitis  - continue avoidance measures for grasses, trees, weeds, dust mites, molds  - take antihistamine like Zyrtec, Xyzal or Allegra daily  - continue Singulair 10mg  daily  -try nasal antihistamine Patanase 2 sprays each nostril twice a day.  This is a nasal spray for nasal drainage.  If this spray is not effective for nasal drainage then will recommend you see ENT again.   You have not seen any effect with use of Astelin or Atrovent nasal sprays  - use Olopatadine 0.2% 1 drop each eye daily as needed for water eyes.    - continue to use your current lubricating eye drop to help manage dry eye  - continue allergen immunotherapy (allergy shots) per schedule.  You are at maintenance dosing.  Lets get through 2 years and see how you are in the winter.    COPD  - use of Tiotropium 2 puffs daily  - have access to  albuterol inhaler 2 puffs every 4-6 hours as needed for cough/wheeze/shortness of breath/chest tightness.  May use 15-20 minutes prior to activity.   Monitor frequency of use.    Follow-up 6 months or sooner if needed  I appreciate the opportunity to take part in [place] care. Please do not hesitate to contact me with questions.  Sincerely,   Prudy Feeler, MD Allergy/Immunology [facility] [place] [facility] [place]

## 2020-07-28 ENCOUNTER — Telehealth: Payer: Self-pay

## 2020-07-28 NOTE — Telephone Encounter (Signed)
They do not to my knowledge. He could go otc with it and pays out of pocket

## 2020-07-28 NOTE — Telephone Encounter (Signed)
He has tried and failed Azelastine nasal spray.    His main issue is nasal drainage which fluticasone does not help with.   [facility]?

## 2020-07-28 NOTE — Telephone Encounter (Signed)
[facility] sent [facility] stating they will not ocver the olopatadine 0.6% nasal spray  But will cover fluticasone nasal spray or azelastine please advise to change

## 2020-08-01 ENCOUNTER — Other Ambulatory Visit: Payer: Self-pay

## 2020-08-01 MED ORDER — OLOPATADINE HCL 0.6 % NA SOLN: 2.0000 | Freq: Two times a day (BID) | NASAL | 5 refills | Status: DC

## 2020-08-01 MED ORDER — OLOPATADINE HCL 0.6 % NA SOLN: 2.0000 | Freq: Two times a day (BID) | NASAL | 5 refills | Status: AC

## 2020-08-01 NOTE — Telephone Encounter (Signed)
Spoke with pts and his son Jeneen Rinks was able to get son directions for using good rx found savings card for $15.77 with savings card Jeneen Rinks will go with his dad to [place] to pick up rx and use savings card and will let us know if he has any issues

## 2020-08-01 NOTE — Telephone Encounter (Signed)
I do not think patanase (olopatadine) nasal spray is available over the counter.  If he is willing to try it we could see if he will get it from a local pharmacy?  I do not know how much it is out of pocket however.  Please direct him to goodrx or similar prescription drug savings site.

## 2020-08-01 NOTE — Telephone Encounter (Signed)
Patient called back about nasal spray. [facility] called [facility] to [facility] that [facility] did [facility]. [facility] confirmed [facility] and informed patient it would be $239, patient states he can not afford medication at this price. Patient is wondering if anything else could be sent in for him.   Please advise.

## 2020-08-01 NOTE — Telephone Encounter (Signed)
Pt is okay with sending [facility] [address] so sending over rx and told him if it is not feasible to go to [email or web address] and look for a savings card pt stated understanding and wanted verification on his next ov

## 2020-08-03 ENCOUNTER — Encounter: Payer: Self-pay | Admitting: Allergy

## 2020-08-03 ENCOUNTER — Ambulatory Visit (INDEPENDENT_AMBULATORY_CARE_PROVIDER_SITE_OTHER): Payer: No Typology Code available for payment source

## 2020-08-03 DIAGNOSIS — J309 Allergic rhinitis, unspecified: Secondary | ICD-10-CM

## 2020-08-17 ENCOUNTER — Encounter: Payer: Self-pay | Admitting: Allergy & Immunology

## 2020-08-17 ENCOUNTER — Ambulatory Visit (INDEPENDENT_AMBULATORY_CARE_PROVIDER_SITE_OTHER): Payer: No Typology Code available for payment source | Admitting: *Deleted

## 2020-08-17 DIAGNOSIS — J309 Allergic rhinitis, unspecified: Secondary | ICD-10-CM | POA: Diagnosis not present

## 2020-08-28 NOTE — Progress Notes (Signed)
EXP  08/30/21 

## 2020-08-30 DIAGNOSIS — J301 Allergic rhinitis due to pollen: Secondary | ICD-10-CM

## 2020-08-31 DIAGNOSIS — J3089 Other allergic rhinitis: Secondary | ICD-10-CM

## 2020-09-14 ENCOUNTER — Encounter: Payer: Self-pay | Admitting: Allergy

## 2020-09-14 ENCOUNTER — Ambulatory Visit (INDEPENDENT_AMBULATORY_CARE_PROVIDER_SITE_OTHER): Payer: No Typology Code available for payment source | Admitting: *Deleted

## 2020-09-14 DIAGNOSIS — J309 Allergic rhinitis, unspecified: Secondary | ICD-10-CM

## 2020-09-27 ENCOUNTER — Ambulatory Visit (INDEPENDENT_AMBULATORY_CARE_PROVIDER_SITE_OTHER): Payer: No Typology Code available for payment source

## 2020-09-27 ENCOUNTER — Encounter: Payer: Self-pay | Admitting: Family Medicine

## 2020-09-27 DIAGNOSIS — J309 Allergic rhinitis, unspecified: Secondary | ICD-10-CM | POA: Diagnosis not present

## 2020-10-12 ENCOUNTER — Encounter: Payer: Self-pay | Admitting: Allergy

## 2020-10-12 ENCOUNTER — Ambulatory Visit (INDEPENDENT_AMBULATORY_CARE_PROVIDER_SITE_OTHER): Payer: No Typology Code available for payment source | Admitting: *Deleted

## 2020-10-12 DIAGNOSIS — J309 Allergic rhinitis, unspecified: Secondary | ICD-10-CM

## 2020-10-19 ENCOUNTER — Ambulatory Visit (INDEPENDENT_AMBULATORY_CARE_PROVIDER_SITE_OTHER): Payer: No Typology Code available for payment source

## 2020-10-19 ENCOUNTER — Encounter: Payer: Self-pay | Admitting: Allergy

## 2020-10-19 DIAGNOSIS — J309 Allergic rhinitis, unspecified: Secondary | ICD-10-CM | POA: Diagnosis not present

## 2020-10-26 ENCOUNTER — Ambulatory Visit (INDEPENDENT_AMBULATORY_CARE_PROVIDER_SITE_OTHER): Payer: No Typology Code available for payment source | Admitting: *Deleted

## 2020-10-26 ENCOUNTER — Encounter: Payer: Self-pay | Admitting: Allergy

## 2020-10-26 DIAGNOSIS — J309 Allergic rhinitis, unspecified: Secondary | ICD-10-CM

## 2020-11-01 ENCOUNTER — Ambulatory Visit (INDEPENDENT_AMBULATORY_CARE_PROVIDER_SITE_OTHER): Payer: No Typology Code available for payment source

## 2020-11-01 ENCOUNTER — Encounter: Payer: Self-pay | Admitting: Allergy

## 2020-11-01 DIAGNOSIS — J309 Allergic rhinitis, unspecified: Secondary | ICD-10-CM

## 2020-11-09 ENCOUNTER — Ambulatory Visit (INDEPENDENT_AMBULATORY_CARE_PROVIDER_SITE_OTHER): Payer: No Typology Code available for payment source | Admitting: *Deleted

## 2020-11-09 ENCOUNTER — Encounter: Payer: Self-pay | Admitting: Allergy & Immunology

## 2020-11-09 DIAGNOSIS — J309 Allergic rhinitis, unspecified: Secondary | ICD-10-CM | POA: Diagnosis not present

## 2020-11-13 ENCOUNTER — Telehealth: Payer: Self-pay | Admitting: Allergy

## 2020-11-13 NOTE — Telephone Encounter (Signed)
Request for new VA authorization has been faxed to [facility], [phone]

## 2020-11-24 NOTE — Telephone Encounter (Signed)
Emailed authorization request [email or web address] on 11-21-2020  [facility] to check on authorization request. Spoke to [place], authorization is pending approval. Once authorization is approved it will be sent out.

## 2020-11-28 NOTE — Telephone Encounter (Signed)
[place] to check on authorization, it was approved yesterday. The authorization is on coordinator's desk Maudie Mercury). Chanel T provided me the contact number for Maudie Mercury [phone]) in case we do not hear anything in the next week or so. Will follow up then.

## 2020-11-29 ENCOUNTER — Encounter: Payer: Self-pay | Admitting: Allergy

## 2020-11-29 ENCOUNTER — Ambulatory Visit (INDEPENDENT_AMBULATORY_CARE_PROVIDER_SITE_OTHER): Payer: No Typology Code available for payment source

## 2020-11-29 DIAGNOSIS — J309 Allergic rhinitis, unspecified: Secondary | ICD-10-CM | POA: Diagnosis not present

## 2020-11-30 DIAGNOSIS — I2510 Atherosclerotic heart disease of native coronary artery without angina pectoris: Secondary | ICD-10-CM | POA: Diagnosis not present

## 2020-11-30 DIAGNOSIS — J309 Allergic rhinitis, unspecified: Secondary | ICD-10-CM | POA: Diagnosis not present

## 2020-11-30 DIAGNOSIS — K08109 Complete loss of teeth, unspecified cause, unspecified class: Secondary | ICD-10-CM | POA: Diagnosis not present

## 2020-11-30 DIAGNOSIS — G8929 Other chronic pain: Secondary | ICD-10-CM | POA: Diagnosis not present

## 2020-11-30 DIAGNOSIS — E785 Hyperlipidemia, unspecified: Secondary | ICD-10-CM | POA: Diagnosis not present

## 2020-11-30 DIAGNOSIS — E559 Vitamin D deficiency, unspecified: Secondary | ICD-10-CM | POA: Diagnosis not present

## 2020-11-30 DIAGNOSIS — G20 Parkinson's disease: Secondary | ICD-10-CM | POA: Diagnosis not present

## 2020-11-30 DIAGNOSIS — H04129 Dry eye syndrome of unspecified lacrimal gland: Secondary | ICD-10-CM | POA: Diagnosis not present

## 2020-11-30 DIAGNOSIS — R69 Illness, unspecified: Secondary | ICD-10-CM | POA: Diagnosis not present

## 2020-11-30 DIAGNOSIS — E039 Hypothyroidism, unspecified: Secondary | ICD-10-CM | POA: Diagnosis not present

## 2020-12-21 ENCOUNTER — Encounter: Payer: Self-pay | Admitting: Allergy

## 2020-12-21 ENCOUNTER — Ambulatory Visit (INDEPENDENT_AMBULATORY_CARE_PROVIDER_SITE_OTHER): Payer: No Typology Code available for payment source | Admitting: *Deleted

## 2020-12-21 ENCOUNTER — Telehealth: Payer: Self-pay | Admitting: *Deleted

## 2020-12-21 DIAGNOSIS — J309 Allergic rhinitis, unspecified: Secondary | ICD-10-CM

## 2020-12-21 NOTE — Telephone Encounter (Signed)
Called and informed patient. Patient verbalized understanding.  

## 2020-12-21 NOTE — Telephone Encounter (Signed)
Patient came in to receive his allergy injections and stated that he had blood work done at the [place] and he had an elevated IgE. He stated that his doctor told him that it could be from his allergies. I have placed the lab result in your office for you to review.

## 2021-01-10 ENCOUNTER — Encounter: Payer: Self-pay | Admitting: *Deleted

## 2021-01-11 ENCOUNTER — Encounter: Payer: Self-pay | Admitting: Allergy

## 2021-01-11 ENCOUNTER — Ambulatory Visit (INDEPENDENT_AMBULATORY_CARE_PROVIDER_SITE_OTHER): Payer: No Typology Code available for payment source | Admitting: *Deleted

## 2021-01-11 DIAGNOSIS — J309 Allergic rhinitis, unspecified: Secondary | ICD-10-CM | POA: Diagnosis not present

## 2021-01-17 DIAGNOSIS — J301 Allergic rhinitis due to pollen: Secondary | ICD-10-CM | POA: Diagnosis not present

## 2021-01-17 NOTE — Progress Notes (Signed)
VIALS MADE. EXP 01-17-22 

## 2021-01-18 DIAGNOSIS — J3089 Other allergic rhinitis: Secondary | ICD-10-CM | POA: Diagnosis not present

## 2021-02-01 ENCOUNTER — Encounter: Payer: Self-pay | Admitting: Allergy

## 2021-02-01 ENCOUNTER — Ambulatory Visit (INDEPENDENT_AMBULATORY_CARE_PROVIDER_SITE_OTHER): Payer: No Typology Code available for payment source | Admitting: Allergy

## 2021-02-01 ENCOUNTER — Other Ambulatory Visit: Payer: Self-pay

## 2021-02-01 VITALS — BP 180/80 | HR 53 | Temp 95.7°F | Resp 30 | Ht 70.0 in | Wt 186.6 lb

## 2021-02-01 DIAGNOSIS — J309 Allergic rhinitis, unspecified: Secondary | ICD-10-CM

## 2021-02-01 DIAGNOSIS — J449 Chronic obstructive pulmonary disease, unspecified: Secondary | ICD-10-CM

## 2021-02-01 DIAGNOSIS — J3089 Other allergic rhinitis: Secondary | ICD-10-CM

## 2021-02-01 DIAGNOSIS — H1013 Acute atopic conjunctivitis, bilateral: Secondary | ICD-10-CM

## 2021-02-01 NOTE — Progress Notes (Signed)
Follow-up Note  RE: Antonio Edwards MRN: [id number] DOB: February 09, 1946 Date of Office Visit: 02/01/2021   History of present illness: Antonio Edwards is a 75 y.o. male presenting today for follow-up of allergic rhinitis with conjunctivitis and COPD.  He was last seen in the office on 07/27/2020 by myself.  He has not had any health changes, surgeries or hospitalizations since his last visit.  He states he is undergoing evaluation of his right shoulder as he states there is "calcium buildup "in the shoulder is causing some decreased movement and pain.  He states he will have an MRI to help determine if he may need future surgery on the shoulder.  He feels like his postnasal drainage is unchanged.  He has not seen any significant improvement with his overall allergy symptoms since he has been on immunotherapy.  He is now in year 2 of his immunotherapy.  He is at maintenance coming every 3 weeks for his injections at this time.  He is tolerating injections well without any large local or systemic reactions.  He is using Singulair daily.  He did use the Patanase nasal spray after the last visit and this too has not been effective.  To date he has not had any response to nasal sprays including nasal antihistamines and nasal anticholinergics.  He states he still does a lot of coughing.  He uses tiotropium daily he has access to this albuterol.  He follows with pulmonologist at the [facility].   Review of systems: Review of Systems  Constitutional: Negative.   HENT:         See HPI  Eyes: Negative.   Respiratory:  Positive for cough.   Cardiovascular: Negative.   Gastrointestinal: Negative.   Musculoskeletal: Negative.   Skin: Negative.   Neurological: Negative.    All other systems negative unless noted above in HPI  Past medical/social/surgical/family history have been reviewed and are unchanged unless specifically indicated below.  No changes  Medication List: Current Outpatient Medications   Medication Sig Dispense Refill   albuterol (VENTOLIN HFA) 108 (90 Base) MCG/ACT inhaler Inhale 2 puffs into the lungs every 6 (six) hours as needed for wheezing or shortness of breath.     aspirin EC 81 MG tablet Take 81 mg by mouth daily.     atorvastatin (LIPITOR) 20 MG tablet Take 60 mg by mouth daily.     Calcium Carbonate-Vitamin D3 600-400 MG-UNIT TABS Take 1 tablet by mouth 2 (two) times daily.     citalopram (CELEXA) 40 MG tablet Take 40 mg by mouth daily.     EPINEPHrine 0.3 mg/0.3 mL IJ SOAJ injection Inject 0.3 mLs (0.3 mg total) into the muscle as needed for anaphylaxis. 2 each 1   GLYCERIN-HYPROMELLOSE-PEG 400 OP Apply 1 drop to eye 2 (two) times daily.     ibuprofen (ADVIL) 600 MG tablet Take 600 mg by mouth 3 (three) times daily as needed.     ketoconazole (NIZORAL) 2 % shampoo Apply 1 application topically 3 (three) times a week.     levothyroxine (SYNTHROID) 75 MCG tablet Take 75 mcg by mouth daily before breakfast.     memantine (NAMENDA) 10 MG tablet Take 20 mg by mouth at bedtime.     montelukast (SINGULAIR) 10 MG tablet Take 1 tablet (10 mg total) by mouth at bedtime. 30 tablet 5   olopatadine (PATANOL) 0.1 % ophthalmic solution Place 1 drop into both eyes 2 (two) times daily. 5 mL 5  Olopatadine HCl 0.6 % SOLN Place 2 sprays into the nose in the morning and at bedtime. 30.5 g 5   rivastigmine (EXELON) 6 MG capsule Take 6 mg by mouth 2 (two) times daily.     alprostadil (MUSE) 125 MCG pellet 125 mcg by Transurethral route as needed for erectile dysfunction. use no more than 3 times per week (Patient not taking: Reported on 02/01/2021)     cetirizine (ZYRTEC) 10 MG tablet Take 1 tablet (10 mg total) by mouth daily. (Patient not taking: Reported on 02/01/2021) 30 tablet 5   levocetirizine (XYZAL) 5 MG tablet Take 1 tablet (5 mg total) by mouth every evening. (Patient not taking: Reported on 02/01/2021) 90 tablet 2   sildenafil (VIAGRA) 100 MG tablet Take 100 mg by mouth daily  as needed for erectile dysfunction. (Patient not taking: Reported on 07/27/2020)     Tiotropium Bromide Monohydrate 2.5 MCG/ACT AERS Inhale 2 puffs into the lungs daily. (Patient not taking: Reported on 07/27/2020)     Vitamin D, Cholecalciferol, 50 MCG (2000 UT) CAPS Take 1 tablet by mouth daily. (Patient not taking: Reported on 02/01/2021)     No current facility-administered medications for this visit.     Known medication allergies: Allergies  Allergen Reactions   Alendronate Other (See Comments)    Red all over, skin begun falling off as if he had been burned   Latex Hives   Aricept [Donepezil Hcl] Other (See Comments)    Loss of taste     Physical examination: Blood pressure (!) 180/80, pulse (!) 53, temperature (!) 95.7 F (35.4 C), temperature source Temporal, resp. rate (!) 30, height 5\' 10"  (1.778 m), weight 186 lb 9.6 oz (84.6 kg), SpO2 96 %.  General: Alert, interactive, in no acute distress. HEENT: PERRLA, TMs pearly gray, turbinates minimally edematous without discharge, post-pharynx non erythematous. Neck: Supple without lymphadenopathy. Lungs: Clear to auscultation without wheezing, rhonchi or rales. {no increased work of breathing. CV: Normal S1, S2 without murmurs. Abdomen: Nondistended, nontender. Skin: Warm and dry, without lesions or rashes. Extremities:  No clubbing, cyanosis or edema. Neuro:   Grossly intact.  Diagnositics/Labs: Immunotherapy injections given today  Assessment and plan:   Allergic rhinitis with conjunctivitis  - continue avoidance measures for grasses, trees, weeds, dust mites, molds  - take Allegra 1 tab daily  - continue Singulair 10mg  daily  - nasal antihistamine sprays or nasal anticholingeric sprays have not been effective in managing your nasal drainage.  Thus stop your nasal sprays  - use Olopatadine 0.2% 1 drop each eye daily as needed for water eyes.    - continue to use your current lubricating eye drop to help manage dry  eye  - continue allergen immunotherapy (allergy shots) per schedule.  You are at maintenance dosing.  Will obtain environmental allergy panel today to see if your allergens have changed at all.    - there is a nasal procedure called ClariFix that has been helpful in managing chronic rhinitis (nasal) symptoms that is not improved with standard allergy based therapies.  Would ask your ENT doctor at [facility] if they perform this procedure so you can discuss if it may be an option for you  COPD  - use of Tiotropium 2 puffs daily  - have access to albuterol inhaler 2 puffs every 4-6 hours as needed for cough/wheeze/shortness of breath/chest tightness.  May use 15-20 minutes prior to activity.   Monitor frequency of use.    Follow-up 6 months or sooner  if needed  I appreciate the opportunity to take part in [place] care. Please do not hesitate to contact me with questions.  Sincerely,   Prudy Feeler, MD Allergy/Immunology [facility]

## 2021-02-01 NOTE — Patient Instructions (Addendum)
Allergic rhinitis with conjunctivitis  - continue avoidance measures for grasses, trees, weeds, dust mites, molds  - take Allegra 1 tab daily  - continue Singulair 10mg  daily  - nasal antihistamine sprays or nasal anticholingeric sprays have not been effective in managing your nasal drainage.  Thus stop your nasal sprays  - use Olopatadine 0.2% 1 drop each eye daily as needed for water eyes.    - continue to use your current lubricating eye drop to help manage dry eye  - continue allergen immunotherapy (allergy shots) per schedule.  You are at maintenance dosing.  Will obtain environmental allergy panel today to see if your allergens have changed at all.    - there is a nasal procedure called ClariFix that has been helpful in managing chronic rhinitis (nasal) symptoms that is not improved with standard allergy based therapies.  Would ask your ENT doctor at [facility] if they perform this procedure so you can discuss if it may be an option for you  COPD  - use of Tiotropium 2 puffs daily  - have access to albuterol inhaler 2 puffs every 4-6 hours as needed for cough/wheeze/shortness of breath/chest tightness.  May use 15-20 minutes prior to activity.   Monitor frequency of use.    Follow-up 6 months or sooner if needed

## 2021-02-07 LAB — ALLERGENS W/TOTAL IGE AREA 2
Alternaria Alternata IgE: 0.28 kU/L — AB
Aspergillus Fumigatus IgE: 1.36 kU/L — AB
Bermuda Grass IgE: 0.27 kU/L — AB
Cat Dander IgE: 0.15 kU/L — AB
Cedar, Mountain IgE: 0.34 kU/L — AB
Cladosporium Herbarum IgE: 0.98 kU/L — AB
Cockroach, German IgE: 0.27 kU/L — AB
Common Silver Birch IgE: 0.19 kU/L — AB
Cottonwood IgE: 0.31 kU/L — AB
D Farinae IgE: 0.58 kU/L — AB
D Pteronyssinus IgE: 0.54 kU/L — AB
Dog Dander IgE: 0.24 kU/L — AB
Elm, American IgE: 0.27 kU/L — AB
IgE (Immunoglobulin E), Serum: 19454 IU/mL — ABNORMAL HIGH (ref 6–495)
Johnson Grass IgE: 0.64 kU/L — AB
Maple/Box Elder IgE: 0.27 kU/L — AB
Mouse Urine IgE: 0.18 kU/L — AB
Oak, White IgE: 0.27 kU/L — AB
Pecan, Hickory IgE: 0.19 kU/L — AB
Penicillium Chrysogen IgE: 2.72 kU/L — AB
Pigweed, Rough IgE: 0.17 kU/L — AB
Ragweed, Short IgE: 0.18 kU/L — AB
Sheep Sorrel IgE Qn: 0.16 kU/L — AB
Timothy Grass IgE: 6.98 kU/L — AB
White Mulberry IgE: 0.23 kU/L — AB

## 2022-03-04 DIAGNOSIS — F0390 Unspecified dementia without behavioral disturbance: Secondary | ICD-10-CM | POA: Diagnosis not present

## 2022-03-04 DIAGNOSIS — F324 Major depressive disorder, single episode, in partial remission: Secondary | ICD-10-CM | POA: Diagnosis not present

## 2022-03-04 DIAGNOSIS — E663 Overweight: Secondary | ICD-10-CM | POA: Diagnosis not present

## 2022-03-04 DIAGNOSIS — Z6828 Body mass index (BMI) 28.0-28.9, adult: Secondary | ICD-10-CM | POA: Diagnosis not present

## 2022-03-04 DIAGNOSIS — E039 Hypothyroidism, unspecified: Secondary | ICD-10-CM | POA: Diagnosis not present

## 2022-03-04 DIAGNOSIS — Z8546 Personal history of malignant neoplasm of prostate: Secondary | ICD-10-CM | POA: Diagnosis not present

## 2022-03-04 DIAGNOSIS — F17210 Nicotine dependence, cigarettes, uncomplicated: Secondary | ICD-10-CM | POA: Diagnosis not present

## 2022-03-04 DIAGNOSIS — K219 Gastro-esophageal reflux disease without esophagitis: Secondary | ICD-10-CM | POA: Diagnosis not present

## 2022-03-04 DIAGNOSIS — E785 Hyperlipidemia, unspecified: Secondary | ICD-10-CM | POA: Diagnosis not present

## 2022-03-04 DIAGNOSIS — R7303 Prediabetes: Secondary | ICD-10-CM | POA: Diagnosis not present

## 2022-03-04 DIAGNOSIS — Z008 Encounter for other general examination: Secondary | ICD-10-CM | POA: Diagnosis not present

## 2022-03-04 DIAGNOSIS — I129 Hypertensive chronic kidney disease with stage 1 through stage 4 chronic kidney disease, or unspecified chronic kidney disease: Secondary | ICD-10-CM | POA: Diagnosis not present

## 2022-03-04 DIAGNOSIS — J439 Emphysema, unspecified: Secondary | ICD-10-CM | POA: Diagnosis not present

## 2022-03-04 DIAGNOSIS — J8410 Pulmonary fibrosis, unspecified: Secondary | ICD-10-CM | POA: Diagnosis not present

## 2022-03-04 DIAGNOSIS — I2584 Coronary atherosclerosis due to calcified coronary lesion: Secondary | ICD-10-CM | POA: Diagnosis not present

## 2022-03-04 DIAGNOSIS — M8580 Other specified disorders of bone density and structure, unspecified site: Secondary | ICD-10-CM | POA: Diagnosis not present

## 2022-03-04 DIAGNOSIS — N182 Chronic kidney disease, stage 2 (mild): Secondary | ICD-10-CM | POA: Diagnosis not present

## 2022-07-29 ENCOUNTER — Emergency Department (HOSPITAL_BASED_OUTPATIENT_CLINIC_OR_DEPARTMENT_OTHER): Payer: No Typology Code available for payment source

## 2022-07-29 ENCOUNTER — Encounter (HOSPITAL_BASED_OUTPATIENT_CLINIC_OR_DEPARTMENT_OTHER): Payer: Self-pay

## 2022-07-29 ENCOUNTER — Ambulatory Visit
Admission: EM | Admit: 2022-07-29 | Discharge: 2022-07-29 | Disposition: A | Discharge status: Home / Self Care | Payer: No Typology Code available for payment source

## 2022-07-29 ENCOUNTER — Emergency Department (HOSPITAL_BASED_OUTPATIENT_CLINIC_OR_DEPARTMENT_OTHER)
Admission: EM | Admit: 2022-07-29 | Discharge: 2022-07-29 | Disposition: A | Discharge status: Home / Self Care | Payer: No Typology Code available for payment source | Attending: Emergency Medicine | Admitting: Emergency Medicine

## 2022-07-29 ENCOUNTER — Other Ambulatory Visit: Payer: Self-pay

## 2022-07-29 DIAGNOSIS — W28XXXA Contact with powered lawn mower, initial encounter: Secondary | ICD-10-CM | POA: Diagnosis not present

## 2022-07-29 DIAGNOSIS — M79642 Pain in left hand: Secondary | ICD-10-CM

## 2022-07-29 DIAGNOSIS — S61419A Laceration without foreign body of unspecified hand, initial encounter: Secondary | ICD-10-CM

## 2022-07-29 DIAGNOSIS — Z91040 Latex allergy status: Secondary | ICD-10-CM | POA: Insufficient documentation

## 2022-07-29 DIAGNOSIS — Z7982 Long term (current) use of aspirin: Secondary | ICD-10-CM | POA: Diagnosis not present

## 2022-07-29 DIAGNOSIS — S61412A Laceration without foreign body of left hand, initial encounter: Secondary | ICD-10-CM | POA: Insufficient documentation

## 2022-07-29 MED ORDER — LIDOCAINE-EPINEPHRINE-TETRACAINE (LET) TOPICAL GEL
3.0000 mL | Freq: Once | TOPICAL | Status: AC
  Administered 2022-07-29: 3 mL via TOPICAL
  Filled 2022-07-29: qty 3

## 2022-07-29 MED ORDER — LIDOCAINE-EPINEPHRINE (PF) 2 %-1:200000 IJ SOLN
20.0000 mL | Freq: Once | INTRAMUSCULAR | Status: AC
  Administered 2022-07-29: 20 mL
  Filled 2022-07-29: qty 20

## 2022-07-29 NOTE — ED Provider Notes (Signed)
[place] EMERGENCY DEPARTMENT AT [facility] Provider Note   CSN: [id number] Arrival date & time: 07/29/22  1801     History {Add pertinent medical, surgical, social history, OB history to HPI:1} Chief Complaint  Patient presents with   Laceration    Antonio Edwards is a 77 y.o. male presents to the ED from urgent care complaining of lacerations to the dorsum of his left hand.  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  The blades of the mower turned on causing damage to the back of the patient's hand.  Patient sent by urgent care due to no x-ray being available.  He has been able to move his hand, wrist, and digits appropriately.  Patient states "I do not think any of the bones are messed up".  His Tdap is up-to-date.  Denies numbness, tingling, or weakness in the hand.  He does not take anticoagulation.      Home Medications Prior to Admission medications   Medication Sig Start Date End Date Taking? Authorizing Provider  albuterol (VENTOLIN HFA) 108 (90 Base) MCG/ACT inhaler Inhale 2 puffs into the lungs every 6 (six) hours as needed for wheezing or shortness of breath.    [provider]  alprostadil (MUSE) 125 MCG pellet 125 mcg by Transurethral route as needed for erectile dysfunction. use no more than 3 times per week Patient not taking: Reported on 02/01/2021    [provider]  aspirin EC 81 MG tablet Take 81 mg by mouth daily.    [provider]  atorvastatin (LIPITOR) 20 MG tablet Take 60 mg by mouth daily.    [provider]  Calcium Carbonate-Vitamin D3 600-400 MG-UNIT TABS Take 1 tablet by mouth 2 (two) times daily.    [provider]  cetirizine (ZYRTEC) 10 MG tablet Take 1 tablet (10 mg total) by mouth daily. Patient not taking: Reported on 02/01/2021 04/25/20   Nehemiah Settle, FNP  citalopram (CELEXA) 40 MG tablet Take 40 mg by mouth  daily.    [provider]  EPINEPHrine 0.3 mg/0.3 mL IJ SOAJ injection Inject 0.3 mLs (0.3 mg total) into the muscle as needed for anaphylaxis. 10/29/18   Marcelyn Bruins, MD  GLYCERIN-HYPROMELLOSE-PEG 400 OP Apply 1 drop to eye 2 (two) times daily.    [provider]  ibuprofen (ADVIL) 600 MG tablet Take 600 mg by mouth 3 (three) times daily as needed.    [provider]  ketoconazole (NIZORAL) 2 % shampoo Apply 1 application topically 3 (three) times a week.    [provider]  levocetirizine (XYZAL) 5 MG tablet Take 1 tablet (5 mg total) by mouth every evening. Patient not taking: Reported on 02/01/2021 10/30/18   Marcelyn Bruins, MD  levothyroxine (SYNTHROID) 75 MCG tablet Take 75 mcg by mouth daily before breakfast.    [provider]  memantine (NAMENDA) 10 MG tablet Take 20 mg by mouth at bedtime.    [provider]  montelukast (SINGULAIR) 10 MG tablet Take 1 tablet (10 mg total) by mouth at bedtime. 04/25/20   Nehemiah Settle, FNP  olopatadine (PATANOL) 0.1 % ophthalmic solution Place 1 drop into both eyes 2 (two) times daily. 04/26/20   Nehemiah Settle, FNP  Olopatadine HCl 0.6 % SOLN Place 2 sprays into the nose in the morning and at bedtime. 08/01/20   Marcelyn Bruins, MD  rivastigmine (EXELON) 6 MG  capsule Take 6 mg by mouth 2 (two) times daily.    [provider]  sildenafil (VIAGRA) 100 MG tablet Take 100 mg by mouth daily as needed for erectile dysfunction. Patient not taking: Reported on 07/27/2020    [provider]  Tiotropium Bromide Monohydrate 2.5 MCG/ACT AERS Inhale 2 puffs into the lungs daily. Patient not taking: Reported on 07/27/2020    [provider]  Vitamin D, Cholecalciferol, 50 MCG (2000 UT) CAPS Take 1 tablet by mouth daily. Patient not taking: Reported on 02/01/2021    [provider]      Allergies    Alendronate, Latex, and Aricept [donepezil hcl]     Review of Systems   Review of Systems  Physical Exam Updated Vital Signs BP 135/71   Pulse 64   Temp 98.2 F (36.8 C)   Resp 16   SpO2 98%  Physical Exam    ED Results / Procedures / Treatments   Labs (all labs ordered are listed, but only abnormal results are displayed) Labs Reviewed - No data to display  EKG None  Radiology DG Hand Complete Left  Result Date: 07/29/2022 CLINICAL DATA:  r/o foreign body cutting his hand "on the blade of the lawn mower" approx 1hr PTA. EXAM: LEFT HAND - COMPLETE 3+ VIEW COMPARISON:  None Available. FINDINGS: There is no evidence of fracture or dislocation. Mild carpal bone degenerative changes. Mild degenerative changes of the first carpometacarpal joint. Mild degenerative changes of the proximal and distal interphalangeal joints. Subcutaneus soft tissue edema between the first and second digit. Subcutaneus soft tissue edema of the volar lateral hand also noted. No retained radiopaque foreign body. IMPRESSION: 1. No acute displaced fracture or dislocation. 2. No retained radiopaque foreign body. Electronically Signed   By: Tish Frederickson M.D.   On: 07/29/2022 19:04    Procedures .Marland KitchenLaceration Repair  Date/Time: 07/29/2022 10:03 PM  Performed by: Lenard Simmer, PA-C Authorized by: Lenard Simmer, PA-C   Consent:    Consent obtained:  Verbal   Consent given by:  Patient   Risks, benefits, and alternatives were discussed: yes     Risks discussed:  Infection, poor cosmetic result, poor wound healing, pain and need for additional repair   Alternatives discussed:  No treatment Universal protocol:    Procedure explained and questions answered to patient or proxy's satisfaction: yes     Patient identity confirmed:  Verbally with patient and arm band Anesthesia:    Anesthesia method:  Topical application and local infiltration   Topical anesthetic:  LET   Local anesthetic:  Lidocaine 2% WITH epi Laceration details:    Location:  Hand    Hand location:  L hand, dorsum   Length (cm):  2.5 Exploration:    Hemostasis achieved with:  LET and direct pressure   Imaging obtained: x-ray     Imaging outcome: foreign body not noted   Treatment:    Area cleansed with:  Saline   Amount of cleaning:  Standard Skin repair:    Repair method:  Sutures   Suture size:  5-0   Suture material:  Prolene   Suture technique:  Simple interrupted   Number of sutures:  6 Approximation:    Approximation:  Close Repair type:    Repair type:  Simple Post-procedure details:    Dressing:  Antibiotic ointment and bulky dressing   Procedure completion:  Tolerated well, no immediate complications .Marland KitchenLaceration Repair  Date/Time: 07/29/2022 10:04 PM  Performed by: Chestine Spore,  Petina Muraski R, PA-C Authorized by: Lenard Simmer, PA-C   Consent:    Consent obtained:  Verbal   Consent given by:  Patient   Risks, benefits, and alternatives were discussed: yes     Risks discussed:  Infection, pain, poor cosmetic result, poor wound healing and need for additional repair   Alternatives discussed:  No treatment Universal protocol:    Procedure explained and questions answered to patient or proxy's satisfaction: yes     Patient identity confirmed:  Verbally with patient Anesthesia:    Anesthesia method:  Topical application and local infiltration   Topical anesthetic:  LET   Local anesthetic:  Lidocaine 2% WITH epi Laceration details:    Location:  Hand   Hand location:  L hand, dorsum   Length (cm):  3 Exploration:    Hemostasis achieved with:  LET and direct pressure   Imaging obtained: x-ray     Imaging outcome: foreign body not noted   Treatment:    Area cleansed with:  Saline   Amount of cleaning:  Standard Skin repair:    Repair method:  Sutures   Suture size:  5-0   Suture material:  Prolene   Suture technique:  Simple interrupted   Number of sutures:  6 Approximation:    Approximation:  Close Repair type:    Repair type:   Simple Post-procedure details:    Dressing:  Antibiotic ointment and bulky dressing   Procedure completion:  Tolerated well, no immediate complications .Marland KitchenLaceration Repair  Date/Time: 07/29/2022 10:08 PM  Performed by: Lenard Simmer, PA-C Authorized by: Lenard Simmer, PA-C   Consent:    Consent obtained:  Verbal   Consent given by:  Patient   Risks, benefits, and alternatives were discussed: yes     Risks discussed:  Infection, need for additional repair, pain, poor cosmetic result and poor wound healing   Alternatives discussed:  No treatment Universal protocol:    Procedure explained and questions answered to patient or proxy's satisfaction: yes     Patient identity confirmed:  Verbally with patient Anesthesia:    Anesthesia method:  Topical application and local infiltration   Topical anesthetic:  LET   Local anesthetic:  Lidocaine 2% WITH epi Laceration details:    Location:  Hand   Hand location:  L hand, dorsum   Length (cm):  2 Exploration:    Hemostasis achieved with:  LET and direct pressure   Imaging obtained: x-ray     Imaging outcome: foreign body not noted   Treatment:    Area cleansed with:  Saline   Amount of cleaning:  Standard Skin repair:    Repair method:  Sutures   Suture size:  5-0   Suture material:  Prolene   Suture technique:  Simple interrupted   Number of sutures:  6 Approximation:    Approximation:  Close Repair type:    Repair type:  Simple Post-procedure details:    Dressing:  Antibiotic ointment and bulky dressing   Procedure completion:  Tolerated well, no immediate complications   {Document cardiac monitor, telemetry assessment procedure when appropriate:1}  Medications Ordered in ED Medications  lidocaine-EPINEPHrine (XYLOCAINE W/EPI) 2 %-1:200000 (PF) injection 20 mL (has no administration in time range)  lidocaine-EPINEPHrine-tetracaine (LET) topical gel (3 mLs Topical Given 07/29/22 2000)    ED Course/ Medical Decision  Making/ A&P   {   Click here for ABCD2, HEART and other calculatorsREFRESH Note before signing :1}  Medical Decision Making Amount and/or Complexity of Data Reviewed Radiology: ordered.  Risk Prescription drug management.   This patient presents to the ED with chief complaint(s) of *** with pertinent past medical history of ***.  The complaint involves an extensive differential diagnosis and also carries with it a high risk of complications and morbidity.    The differential diagnosis includes ***   The initial plan is to ***  Additional history obtained: Additional history obtained from {additional history:26846} Records reviewed {records:26847}  Initial Assessment:   ***  Independent ECG/labs interpretation:  The following labs were independently interpreted:  ***  Independent visualization and interpretation of imaging: I independently visualized the following imaging with scope of interpretation limited to determining acute life threatening conditions related to emergency care: ***, which revealed ***  Treatment and Reassessment: ***  Other treatment options considered:   ***  Disposition:   ***  Social Determinants of Health:   Patient's {ZOXW:96045}  increases the complexity of managing their presentation   {Document critical care time when appropriate:1} {Document review of labs and clinical decision tools ie heart score, Chads2Vasc2 etc:1}  {Document your independent review of radiology images, and any outside records:1} {Document your discussion with family members, caretakers, and with consultants:1} {Document social determinants of health affecting pt's care:1} {Document your decision making why or why not admission, treatments were needed:1} Final Clinical Impression(s) / ED Diagnoses Final diagnoses:  Laceration of dorsum of hand    Rx / DC Orders ED Discharge Orders     None

## 2022-07-29 NOTE — ED Notes (Signed)
Patient is being discharged from the Urgent Care and sent to the Emergency Department via self . Per Dr. Leonard Schwartz, patient is in need of higher level of care due to laceration. Patient is aware and verbalizes understanding of plan of care. There were no vitals filed for this visit.

## 2022-07-29 NOTE — ED Triage Notes (Addendum)
Pt reports cutting his hand "on the blade of the lawn mower" approx 1hr PTA. Denies thinners, reports tdap UTD. CNS intact distal to injury, pt states "I don't think any of the bones are messed up." Seen at [facility] for same, sent to ED for xray. Bleeding controlled at time of triage

## 2022-07-29 NOTE — Discharge Instructions (Addendum)
Thank you for allowing me to be part of your care today.  Your lacerations on the back of your hand were repaired with sutures that will need to be removed in 7 to 10 days.  You may return to the ER, urgent care or inquire at the [place] to see if this can be done.  Please keep this area clean and dry.  I recommend using antibiotic ointment and keeping it covered, especially if you are working with your hands.  I recommend using antibacterial soap such as Dial and then patting dry.  You may resume normal washing of hands tomorrow.  Return to the ED if you develop sudden worsening of your symptoms, develop signs of infection or if you have any new concerns.

## 2022-07-29 NOTE — Discharge Instructions (Signed)
Patient will proceed by private car to the emergency room for further evaluation and for treatment that we cannot provide here today as we do not have x-ray.

## 2022-07-29 NOTE — ED Provider Notes (Signed)
Here for cuts to the dorsum of his left hand.  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  He states he sustained cuts to his left hand on the dorsum and a couple of smaller ones on the wrist.  This happened about half an hour ago.  Nothing hit his head and he did not have any loss of consciousness.  He does take aspirin.  In the exam room he is alert and oriented and calm.  Heart rate by me is 84  The cuts are just using a little bit and not actively bleeding.  He has approximately 3 on the dorsum of his hand and 2 small ones on the volar wrist.  There is some swelling of the dorsum of the hand.  We do not have x-ray in the building today.  I think it is important that he have imaging with the velocity that the lawnmower blade could have hit his hand.  He will present to the emergency room for evaluation and treatment there.   Zenia Resides, MD 07/29/22 [id number]

## 2022-07-29 NOTE — ED Notes (Signed)
Pt triaged by provider. Recommend higher level of care.

## 2022-08-08 ENCOUNTER — Emergency Department (HOSPITAL_BASED_OUTPATIENT_CLINIC_OR_DEPARTMENT_OTHER)
Admission: EM | Admit: 2022-08-08 | Discharge: 2022-08-08 | Disposition: A | Discharge status: Home / Self Care | Payer: No Typology Code available for payment source | Attending: Emergency Medicine | Admitting: Emergency Medicine

## 2022-08-08 ENCOUNTER — Other Ambulatory Visit: Payer: Self-pay

## 2022-08-08 ENCOUNTER — Encounter (HOSPITAL_BASED_OUTPATIENT_CLINIC_OR_DEPARTMENT_OTHER): Payer: Self-pay | Admitting: Emergency Medicine

## 2022-08-08 DIAGNOSIS — Z7982 Long term (current) use of aspirin: Secondary | ICD-10-CM | POA: Insufficient documentation

## 2022-08-08 DIAGNOSIS — Z4802 Encounter for removal of sutures: Secondary | ICD-10-CM | POA: Insufficient documentation

## 2022-08-08 DIAGNOSIS — Z91040 Latex allergy status: Secondary | ICD-10-CM | POA: Diagnosis not present

## 2022-08-08 NOTE — ED Notes (Signed)
Discharge paperwork given and verbally understood. 

## 2022-08-08 NOTE — ED Provider Notes (Signed)
[place] EMERGENCY DEPARTMENT AT [facility] Provider Note   CSN: [id number] Arrival date & time: 08/08/22  4098     History  Chief Complaint  Patient presents with   Suture / Staple Removal    Antonio Edwards is a 77 y.o. male returns to the ED today for suture removal.  Patient had sutures placed 07/29/22 after injuring his hand with lawn mower blades.  He reports that his wound have been healing well.  Reports some swelling and leaking of clear yellow fluid, but no purulent drainage or bleeding.        Home Medications Prior to Admission medications   Medication Sig Start Date End Date Taking? Authorizing Provider  albuterol (VENTOLIN HFA) 108 (90 Base) MCG/ACT inhaler Inhale 2 puffs into the lungs every 6 (six) hours as needed for wheezing or shortness of breath.    [provider]  alprostadil (MUSE) 125 MCG pellet 125 mcg by Transurethral route as needed for erectile dysfunction. use no more than 3 times per week Patient not taking: Reported on 02/01/2021    [provider]  aspirin EC 81 MG tablet Take 81 mg by mouth daily.    [provider]  atorvastatin (LIPITOR) 20 MG tablet Take 60 mg by mouth daily.    [provider]  Calcium Carbonate-Vitamin D3 600-400 MG-UNIT TABS Take 1 tablet by mouth 2 (two) times daily.    [provider]  cetirizine (ZYRTEC) 10 MG tablet Take 1 tablet (10 mg total) by mouth daily. Patient not taking: Reported on 02/01/2021 04/25/20   Nehemiah Settle, FNP  citalopram (CELEXA) 40 MG tablet Take 40 mg by mouth daily.    [provider]  EPINEPHrine 0.3 mg/0.3 mL IJ SOAJ injection Inject 0.3 mLs (0.3 mg total) into the muscle as needed for anaphylaxis. 10/29/18   Marcelyn Bruins, MD  GLYCERIN-HYPROMELLOSE-PEG 400 OP Apply 1 drop to eye 2 (two) times daily.    [provider]  ibuprofen (ADVIL) 600 MG tablet Take 600 mg by mouth 3 (three) times daily as needed.     [provider]  ketoconazole (NIZORAL) 2 % shampoo Apply 1 application topically 3 (three) times a week.    [provider]  levocetirizine (XYZAL) 5 MG tablet Take 1 tablet (5 mg total) by mouth every evening. Patient not taking: Reported on 02/01/2021 10/30/18   Marcelyn Bruins, MD  levothyroxine (SYNTHROID) 75 MCG tablet Take 75 mcg by mouth daily before breakfast.    [provider]  memantine (NAMENDA) 10 MG tablet Take 20 mg by mouth at bedtime.    [provider]  montelukast (SINGULAIR) 10 MG tablet Take 1 tablet (10 mg total) by mouth at bedtime. 04/25/20   Nehemiah Settle, FNP  olopatadine (PATANOL) 0.1 % ophthalmic solution Place 1 drop into both eyes 2 (two) times daily. 04/26/20   Nehemiah Settle, FNP  Olopatadine HCl 0.6 % SOLN Place 2 sprays into the nose in the morning and at bedtime. 08/01/20   Marcelyn Bruins, MD  rivastigmine (EXELON) 6 MG capsule Take 6 mg by mouth 2 (two) times daily.    [provider]  sildenafil (VIAGRA) 100 MG tablet Take 100 mg by mouth daily as needed for erectile dysfunction. Patient not taking: Reported on 07/27/2020    [provider]  Tiotropium Bromide Monohydrate 2.5 MCG/ACT AERS Inhale 2 puffs into the lungs daily. Patient not taking: Reported on 07/27/2020    [provider]  Vitamin D, Cholecalciferol, 50 MCG (2000 UT) CAPS Take 1 tablet by mouth daily. Patient not taking: Reported on 02/01/2021    [provider]      Allergies    Alendronate, Latex, and Aricept [donepezil hcl]    Review of Systems   Review of Systems  Physical Exam Updated Vital Signs BP 133/78   Pulse (!) 50   Temp 98.2 F (36.8 C) (Oral)   Resp 18   SpO2 98%  Physical Exam Vitals and nursing note reviewed.  Constitutional:      General: He is not in acute distress.    Appearance: Normal appearance. He is not ill-appearing or diaphoretic.  Cardiovascular:     Rate and  Rhythm: Normal rate and regular rhythm.  Pulmonary:     Effort: Pulmonary effort is normal.  Musculoskeletal:     Left hand: No tenderness. Normal range of motion. Normal sensation. Normal capillary refill. Normal pulse.     Comments: Healing wounds to dorsum of left hand.  There are scabs present.  No drainage or bleeding.  Skin around wounds is pink, but not erythematous or with increased warmth.  He has full ROM of left hand and digits.  Left hand and digits are neurovascularly intact.    Neurological:     Mental Status: He is alert. Mental status is at baseline.  Psychiatric:        Mood and Affect: Mood normal.        Behavior: Behavior normal.     ED Results / Procedures / Treatments   Labs (all labs ordered are listed, but only abnormal results are displayed) Labs Reviewed - No data to display  EKG None  Radiology No results found.  Procedures .Suture Removal  Date/Time: 08/08/2022 9:28 AM  Performed by: Lenard Simmer, PA-C Authorized by: Lenard Simmer, PA-C   Consent:    Consent obtained:  Verbal   Consent given by:  Patient   Risks, benefits, and alternatives were discussed: yes     Risks discussed:  Bleeding, pain and wound separation   Alternatives discussed:  No treatment Universal protocol:    Procedure explained and questions answered to patient or proxy's satisfaction: yes     Patient identity confirmed:  Verbally with patient and arm band Location:    Location:  Upper extremity   Upper extremity location:  Hand   Hand location:  L hand Procedure details:    Wound appearance:  Good wound healing, no signs of infection, clean and pink   Number of sutures removed:  18 Post-procedure details:    Post-removal:  Antibiotic ointment applied and Band-Aid applied   Procedure completion:  Tolerated well, no immediate complications     Medications Ordered in ED Medications - No data to display  ED Course/ Medical Decision Making/ A&P                              Medical Decision Making  Patient presents to the ED for suture removal from the left hand.  Patient reports he has been using antibiotic spray on the back of his hand daily.  He reports no purulent drainage.  He states that he occasionally has "leaking of clear yellow fluid".  Dorsum of the left hand with multiple scabs and good wound healing.  Sutures identified.  Borders of wounds are pink, not erythematous and without increased warmth.  He has normal ROM of left hand  and digits.  Hand is neurovascularly intact.  There is no drainage or bleeding.  Sutures were removed successfully.  Patient's wound covered with bacitracin ointment and Band-Aids.  Discussed continued wound care with patient and monitoring signs of infection.  Advised patient if he should develop signs of infection, to have his wounds rechecked.  Patient is stable and appropriate for discharge home.  Patient verbalized his understanding and is in agreement with plan of discharge.          Final Clinical Impression(s) / ED Diagnoses Final diagnoses:  Encounter for removal of sutures    Rx / DC Orders ED Discharge Orders     None         Lenard Simmer, PA-C 08/08/22 0930    Tegeler, Canary Brim, MD 08/08/22 1521

## 2022-08-08 NOTE — Discharge Instructions (Addendum)
Thank you for allowing me to be a part of your care today.   Your sutures were removed successfully.  I have attached information about continued care of your healing wounds.  Continue to monitor for signs of infection.  If infection develops, please have your wound re-checked.   I recommend using Neosporin or other antibiotic ointment daily.

## 2022-08-08 NOTE — ED Triage Notes (Signed)
Pt arrives to ED for suture removal
